# Patient Record
Sex: Male | Born: 1975
Health system: Southern US, Community
[De-identification: ages and names within clinical notes are randomized; demographics above are authoritative.]

## PROBLEM LIST (undated history)

## (undated) DIAGNOSIS — K219 Gastro-esophageal reflux disease without esophagitis: Secondary | ICD-10-CM

## (undated) DIAGNOSIS — J45909 Unspecified asthma, uncomplicated: Secondary | ICD-10-CM

## (undated) DIAGNOSIS — G8929 Other chronic pain: Secondary | ICD-10-CM

## (undated) DIAGNOSIS — I10 Essential (primary) hypertension: Secondary | ICD-10-CM

## (undated) DIAGNOSIS — T7840XA Allergy, unspecified, initial encounter: Secondary | ICD-10-CM

## (undated) DIAGNOSIS — F419 Anxiety disorder, unspecified: Secondary | ICD-10-CM

## (undated) DIAGNOSIS — R51 Headache: Secondary | ICD-10-CM

## (undated) DIAGNOSIS — R519 Headache, unspecified: Secondary | ICD-10-CM

## (undated) HISTORY — DX: Allergy, unspecified, initial encounter: T78.40XA

## (undated) HISTORY — PX: SHOULDER SURGERY: SHX246

## (undated) HISTORY — DX: Gastro-esophageal reflux disease without esophagitis: K21.9

## (undated) HISTORY — DX: Other chronic pain: G89.29

## (undated) HISTORY — DX: Essential (primary) hypertension: I10

## (undated) HISTORY — DX: Headache: R51

## (undated) HISTORY — DX: Anxiety disorder, unspecified: F41.9

## (undated) HISTORY — DX: Unspecified asthma, uncomplicated: J45.909

## (undated) HISTORY — PX: HAND SURGERY: SHX662

## (undated) HISTORY — DX: Headache, unspecified: R51.9

---

## 1997-07-28 ENCOUNTER — Emergency Department (HOSPITAL_COMMUNITY): Admission: EM | Admit: 1997-07-28 | Discharge: 1997-07-28 | Payer: Self-pay | Admitting: Emergency Medicine

## 1999-10-30 ENCOUNTER — Encounter: Payer: Self-pay | Admitting: Emergency Medicine

## 1999-10-30 ENCOUNTER — Emergency Department (HOSPITAL_COMMUNITY): Admission: EM | Admit: 1999-10-30 | Discharge: 1999-10-30 | Payer: Self-pay | Admitting: Emergency Medicine

## 2000-02-24 ENCOUNTER — Encounter: Payer: Self-pay | Admitting: General Surgery

## 2000-02-24 ENCOUNTER — Encounter: Payer: Self-pay | Admitting: Emergency Medicine

## 2000-02-24 ENCOUNTER — Inpatient Hospital Stay (HOSPITAL_COMMUNITY): Admission: EM | Admit: 2000-02-24 | Discharge: 2000-02-28 | Payer: Self-pay

## 2000-02-25 ENCOUNTER — Encounter: Payer: Self-pay | Admitting: General Surgery

## 2000-12-08 ENCOUNTER — Encounter: Payer: Self-pay | Admitting: Emergency Medicine

## 2000-12-08 ENCOUNTER — Emergency Department (HOSPITAL_COMMUNITY): Admission: EM | Admit: 2000-12-08 | Discharge: 2000-12-08 | Payer: Self-pay

## 2001-01-23 ENCOUNTER — Emergency Department (HOSPITAL_COMMUNITY): Admission: EM | Admit: 2001-01-23 | Discharge: 2001-01-23 | Payer: Self-pay

## 2002-04-18 ENCOUNTER — Encounter: Payer: Self-pay | Admitting: Emergency Medicine

## 2002-04-18 ENCOUNTER — Emergency Department (HOSPITAL_COMMUNITY): Admission: EM | Admit: 2002-04-18 | Discharge: 2002-04-18 | Payer: Self-pay | Admitting: Emergency Medicine

## 2002-04-22 ENCOUNTER — Ambulatory Visit (HOSPITAL_BASED_OUTPATIENT_CLINIC_OR_DEPARTMENT_OTHER): Admission: RE | Admit: 2002-04-22 | Discharge: 2002-04-22 | Payer: Self-pay | Admitting: Orthopedic Surgery

## 2003-01-24 ENCOUNTER — Emergency Department (HOSPITAL_COMMUNITY): Admission: AD | Admit: 2003-01-24 | Discharge: 2003-01-24 | Payer: Self-pay | Admitting: Family Medicine

## 2003-04-03 ENCOUNTER — Emergency Department (HOSPITAL_COMMUNITY): Admission: AD | Admit: 2003-04-03 | Discharge: 2003-04-03 | Payer: Self-pay | Admitting: Internal Medicine

## 2003-04-13 ENCOUNTER — Ambulatory Visit (HOSPITAL_BASED_OUTPATIENT_CLINIC_OR_DEPARTMENT_OTHER): Admission: RE | Admit: 2003-04-13 | Discharge: 2003-04-13 | Payer: Self-pay | Admitting: Orthopedic Surgery

## 2005-04-02 ENCOUNTER — Emergency Department (HOSPITAL_COMMUNITY): Admission: EM | Admit: 2005-04-02 | Discharge: 2005-04-02 | Payer: Self-pay | Admitting: Family Medicine

## 2005-08-16 ENCOUNTER — Ambulatory Visit: Payer: Self-pay | Admitting: Family Medicine

## 2005-09-17 ENCOUNTER — Ambulatory Visit: Payer: Self-pay | Admitting: Family Medicine

## 2006-01-14 ENCOUNTER — Emergency Department (HOSPITAL_COMMUNITY): Admission: EM | Admit: 2006-01-14 | Discharge: 2006-01-14 | Payer: Self-pay | Admitting: Emergency Medicine

## 2006-03-12 ENCOUNTER — Ambulatory Visit: Payer: Self-pay | Admitting: Family Medicine

## 2007-07-06 ENCOUNTER — Emergency Department (HOSPITAL_COMMUNITY): Admission: EM | Admit: 2007-07-06 | Discharge: 2007-07-06 | Payer: Self-pay | Admitting: Emergency Medicine

## 2007-08-04 ENCOUNTER — Ambulatory Visit: Payer: Self-pay | Admitting: Family Medicine

## 2007-09-15 ENCOUNTER — Ambulatory Visit: Payer: Self-pay | Admitting: Family Medicine

## 2008-01-19 ENCOUNTER — Encounter: Admission: RE | Admit: 2008-01-19 | Discharge: 2008-01-19 | Payer: Self-pay | Admitting: Internal Medicine

## 2008-03-12 ENCOUNTER — Ambulatory Visit (HOSPITAL_BASED_OUTPATIENT_CLINIC_OR_DEPARTMENT_OTHER): Admission: RE | Admit: 2008-03-12 | Discharge: 2008-03-12 | Payer: Self-pay | Admitting: Urology

## 2008-06-30 ENCOUNTER — Encounter (INDEPENDENT_AMBULATORY_CARE_PROVIDER_SITE_OTHER): Payer: Self-pay | Admitting: *Deleted

## 2008-06-30 ENCOUNTER — Emergency Department (HOSPITAL_COMMUNITY): Admission: EM | Admit: 2008-06-30 | Discharge: 2008-06-30 | Payer: Self-pay | Admitting: Emergency Medicine

## 2008-07-01 ENCOUNTER — Ambulatory Visit: Payer: Self-pay | Admitting: Family Medicine

## 2008-07-01 DIAGNOSIS — R209 Unspecified disturbances of skin sensation: Secondary | ICD-10-CM | POA: Insufficient documentation

## 2008-07-01 DIAGNOSIS — F411 Generalized anxiety disorder: Secondary | ICD-10-CM | POA: Insufficient documentation

## 2008-07-01 DIAGNOSIS — I10 Essential (primary) hypertension: Secondary | ICD-10-CM

## 2008-07-01 LAB — CONVERTED CEMR LAB
Bilirubin Urine: NEGATIVE
Glucose, Urine, Semiquant: NEGATIVE
Urobilinogen, UA: NEGATIVE
WBC Urine, dipstick: NEGATIVE
pH: 5

## 2008-07-02 LAB — CONVERTED CEMR LAB
ALT: 30 units/L (ref 0–53)
AST: 25 units/L (ref 0–37)
Alkaline Phosphatase: 53 units/L (ref 39–117)
Basophils Absolute: 0 10*3/uL (ref 0.0–0.1)
CO2: 28 meq/L (ref 19–32)
Calcium: 9.3 mg/dL (ref 8.4–10.5)
Chlamydia, Swab/Urine, PCR: NEGATIVE
Creatinine, Ser: 1.2 mg/dL (ref 0.4–1.5)
Eosinophils Relative: 3.5 % (ref 0.0–5.0)
GC Probe Amp, Urine: NEGATIVE
GFR calc non Af Amer: 88.7 mL/min (ref >60)
HCT: 42.6 % (ref 39.0–52.0)
HDL: 49.1 mg/dL (ref >39.00)
Hemoglobin: 14.6 g/dL (ref 13.0–17.0)
Lymphocytes Relative: 29 % (ref 12.0–46.0)
MCV: 95 fL (ref 78.0–100.0)
Monocytes Relative: 5.1 % (ref 3.0–12.0)
Neutro Abs: 2.3 10*3/uL (ref 1.4–7.7)
Platelets: 252 10*3/uL (ref 150.0–400.0)
RBC: 4.49 M/uL (ref 4.22–5.81)
RDW: 12.3 % (ref 11.5–14.6)
Sodium: 140 meq/L (ref 135–145)
Total Bilirubin: 1 mg/dL (ref 0.3–1.2)
Total CHOL/HDL Ratio: 3
Triglycerides: 76 mg/dL (ref 0.0–149.0)
VLDL: 15.2 mg/dL (ref 0.0–40.0)

## 2008-07-05 ENCOUNTER — Encounter (INDEPENDENT_AMBULATORY_CARE_PROVIDER_SITE_OTHER): Payer: Self-pay | Admitting: *Deleted

## 2008-07-07 ENCOUNTER — Telehealth: Payer: Self-pay | Admitting: Family Medicine

## 2008-07-14 ENCOUNTER — Ambulatory Visit: Payer: Self-pay | Admitting: Family Medicine

## 2008-07-19 ENCOUNTER — Ambulatory Visit: Payer: Self-pay | Admitting: Family Medicine

## 2008-07-22 ENCOUNTER — Telehealth (INDEPENDENT_AMBULATORY_CARE_PROVIDER_SITE_OTHER): Payer: Self-pay | Admitting: *Deleted

## 2008-08-05 ENCOUNTER — Telehealth (INDEPENDENT_AMBULATORY_CARE_PROVIDER_SITE_OTHER): Payer: Self-pay | Admitting: *Deleted

## 2008-10-12 ENCOUNTER — Emergency Department (HOSPITAL_COMMUNITY): Admission: EM | Admit: 2008-10-12 | Discharge: 2008-10-12 | Payer: Self-pay | Admitting: Emergency Medicine

## 2009-09-20 ENCOUNTER — Ambulatory Visit: Payer: Self-pay | Admitting: Family Medicine

## 2009-09-20 DIAGNOSIS — J45909 Unspecified asthma, uncomplicated: Secondary | ICD-10-CM | POA: Insufficient documentation

## 2009-09-20 DIAGNOSIS — K219 Gastro-esophageal reflux disease without esophagitis: Secondary | ICD-10-CM

## 2009-09-20 DIAGNOSIS — R51 Headache: Secondary | ICD-10-CM

## 2009-09-20 DIAGNOSIS — J309 Allergic rhinitis, unspecified: Secondary | ICD-10-CM | POA: Insufficient documentation

## 2009-09-20 DIAGNOSIS — R519 Headache, unspecified: Secondary | ICD-10-CM | POA: Insufficient documentation

## 2009-09-20 LAB — CONVERTED CEMR LAB
Bilirubin Urine: NEGATIVE
Nitrite: NEGATIVE
Urobilinogen, UA: 0.2

## 2009-09-21 LAB — CONVERTED CEMR LAB
Albumin: 4 g/dL (ref 3.5–5.2)
BUN: 17 mg/dL (ref 6–23)
Basophils Absolute: 0.1 10*3/uL (ref 0.0–0.1)
CO2: 27 meq/L (ref 19–32)
Chloride: 104 meq/L (ref 96–112)
Cholesterol: 169 mg/dL (ref 0–200)
Creatinine, Ser: 1 mg/dL (ref 0.4–1.5)
Eosinophils Absolute: 0.4 10*3/uL (ref 0.0–0.7)
Glucose, Bld: 77 mg/dL (ref 70–99)
HCT: 38.6 % — ABNORMAL LOW (ref 39.0–52.0)
LDL Cholesterol: 97 mg/dL (ref 0–99)
Lymphs Abs: 1.5 10*3/uL (ref 0.7–4.0)
MCHC: 34.4 g/dL (ref 30.0–36.0)
MCV: 93.5 fL (ref 78.0–100.0)
Monocytes Absolute: 0.7 10*3/uL (ref 0.1–1.0)
Neutro Abs: 4.9 10*3/uL (ref 1.4–7.7)
Neutrophils Relative %: 65.2 % (ref 43.0–77.0)
Platelets: 279 10*3/uL (ref 150.0–400.0)
RDW: 13.4 % (ref 11.5–14.6)
TSH: 0.69 microintl units/mL (ref 0.35–5.50)
Total Protein: 6.5 g/dL (ref 6.0–8.3)
Triglycerides: 174 mg/dL — ABNORMAL HIGH (ref 0.0–149.0)
WBC: 7.5 10*3/uL (ref 4.5–10.5)

## 2009-11-21 ENCOUNTER — Telehealth: Payer: Self-pay | Admitting: Family Medicine

## 2009-11-28 ENCOUNTER — Ambulatory Visit: Payer: Self-pay | Admitting: Family Medicine

## 2009-12-02 ENCOUNTER — Ambulatory Visit: Payer: Self-pay | Admitting: Family Medicine

## 2009-12-12 ENCOUNTER — Ambulatory Visit: Payer: Self-pay | Admitting: Family Medicine

## 2010-02-17 ENCOUNTER — Telehealth: Payer: Self-pay | Admitting: Family Medicine
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
## 2010-02-21 NOTE — Progress Notes (Signed)
Summary: REQ FOR CHANGE IN DOSAGE OF MED (Lisinopril)  Phone Note Call from Patient   Caller: Patient   [phone] Summary of Call: Pt feels that his Lisnopril may be too strong.Marland KitchenMarland KitchenMarland KitchenMarland Kitchen wants to know if he can have dosage decreased.... Advised he is exp a "jittery" feeling when he exerts any energy and just doesn't feel like himself - feels that this is due to the medication.... Pt would like a return call at  [phone].  Initial call taken by: Debbra Riding,  November 21, 2009 9:08 AM  Follow-up for Phone Call        problem+++++++++++++++================ov Follow-up by: Roderick Pee MD,  November 21, 2009 9:10 AM  Additional Follow-up for Phone Call Additional follow up Details #1::        I offered him an OV but he stated that he wanted to speak with someone first before coming in but I will tell him again that he needs to come in for OV.  Called and left msg for pt to c/b so appt could be rescheduled..... Debbra Riding  November 21, 2009 10:30 AM  LMOM....Marland KitchenMarland KitchenDebbra Riding  November 21, 2009 12:07 PM   Additional Follow-up by: Debbra Riding,  November 21, 2009 9:46 AM    Additional Follow-up for Phone Call Additional follow up Details #2::    I made contact with pt and appt was scheduled for 11/28/2009  at  1:45pm.  Follow-up by: Debbra Riding,  November 21, 2009 3:18 PM

## 2010-03-01 NOTE — Progress Notes (Signed)
Summary: REQUEST FOR PHARMACY CLARIFICATION  Phone Note Call from Patient   Caller: Patient Summary of Call: Pt states that [facility] is telling him that they do not have a Rx for Maxide / TRIAMTERENE-HCTZ..... Pt would like someone to call [facility] to clarify same.... Pt can be reached at  [phone] with any questions or concerns.  Initial call taken by: Debbra Riding,  February 17, 2010 10:23 AM  Follow-up for Phone Call        [facility] please call and clarify Follow-up by: Roderick Pee MD,  February 17, 2010 2:13 PM  Additional Follow-up for Phone Call Additional follow up Details #1::        Phone Call Completed Additional Follow-up by: Kern Reap CMA Duncan Dull),  February 20, 2010 11:43 AM

## 2010-04-17 ENCOUNTER — Ambulatory Visit: Payer: Self-pay | Admitting: Family Medicine

## 2010-04-18 ENCOUNTER — Encounter: Payer: Self-pay | Admitting: Family Medicine

## 2010-04-18 ENCOUNTER — Ambulatory Visit (INDEPENDENT_AMBULATORY_CARE_PROVIDER_SITE_OTHER): Payer: 59 | Admitting: Family Medicine

## 2010-04-18 DIAGNOSIS — M25519 Pain in unspecified shoulder: Secondary | ICD-10-CM

## 2010-04-18 DIAGNOSIS — J45909 Unspecified asthma, uncomplicated: Secondary | ICD-10-CM

## 2010-04-18 DIAGNOSIS — M25511 Pain in right shoulder: Secondary | ICD-10-CM

## 2010-04-18 DIAGNOSIS — J309 Allergic rhinitis, unspecified: Secondary | ICD-10-CM

## 2010-04-18 MED ORDER — PREDNISONE 20 MG PO TABS: ORAL_TABLET | ORAL | Status: DC

## 2010-04-18 NOTE — Patient Instructions (Signed)
Prednisone two tabs x 3 days, one x 3 days, a half x 3 days, then half a tablet Monday, Wednesday, Friday, for a 3-week taper.  Return in one week for follow-up.  Call the folks at [facility] for evaluation of your shoulder........Marland Kitchen Dr. Cleophas Dunker
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
## 2010-04-25 ENCOUNTER — Ambulatory Visit (INDEPENDENT_AMBULATORY_CARE_PROVIDER_SITE_OTHER): Payer: 59 | Admitting: Family Medicine

## 2010-04-25 ENCOUNTER — Encounter: Payer: Self-pay | Admitting: Family Medicine

## 2010-04-25 DIAGNOSIS — J45909 Unspecified asthma, uncomplicated: Secondary | ICD-10-CM

## 2010-04-25 DIAGNOSIS — M25511 Pain in right shoulder: Secondary | ICD-10-CM

## 2010-04-25 MED ORDER — HYDROCODONE-ACETAMINOPHEN 7.5-750 MG PO TABS: 1.0000 | ORAL_TABLET | Freq: Four times a day (QID) | ORAL | Status: DC | PRN

## 2010-04-25 MED ORDER — ALBUTEROL 90 MCG/ACT IN AERS: INHALATION_SPRAY | RESPIRATORY_TRACT | Status: DC

## 2010-04-25 NOTE — Progress Notes (Signed)
  Subjective:    Patient ID: [name], male    DOB: 11-13-1975, 35 y.o.   MRN: [id number]  HPIlamont Is a 35 year old male, who comes in today for reevaluation of asthma.  He is currently on tube prednisone tablets daily, and has been for the past week.  He is not tapered because he has not felt back to normal.  He went jogging today and had a flareup of his asthma.  He thinks he has an inhaler at home, but is not sure.  He also had an injection by Dr. Cleophas Dunker in his right shoulder.  Yesterday however today his pain is worse.  Advised to call the orthopedist and go back and see him tomorrow    Review of Systems General and pulmonary review of systems otherwise negative    Objective:   Physical Exam    Well-developed well-nourished, male in no acute distress.  Lungs are clear.  No wheezing    Assessment & Plan:  Asthma resolving Taper prednisone.  Exercise-induced asthma.  Albuterol one shot 15 minutes prior to exercise

## 2010-04-25 NOTE — Patient Instructions (Signed)
Begin to taper the prednisone by taking one tablet daily for 3 days, a half x 3 days, then half a tablet Monday, Wednesday, Friday, for a two-week taper.  Albuterol one puff 15 minutes prior to exercise.  Return p.r.n.Jeanene Erb the orthopedist and make an appointment to be seen tomorrow for your shoulder pain

## 2010-05-09 LAB — POCT HEMOGLOBIN-HEMACUE: Hemoglobin: 15.6 g/dL (ref 13.0–17.0)

## 2010-06-06 NOTE — Op Note (Signed)
[name], [name]             ACCOUNT NO.:  [id number]   MEDICAL RECORD NO.:  [id number]          PATIENT TYPE:  AMB   LOCATION:  [facility]                         FACILITY:  [facility]   PHYSICIAN:  Mark C. Vernie Ammons, M.D.  DATE OF BIRTH:  Nov 25, 1975   DATE OF PROCEDURE:  03/12/2008  DATE OF DISCHARGE:                               OPERATIVE REPORT   PREOPERATIVE DIAGNOSES:  1. Extensive glandular adhesions.  2. Patient desires sterility.   POSTOPERATIVE DIAGNOSES:  1. Extensive glandular adhesions.  2. Patient desires sterility.   PROCEDURE:  1. Plastic correction of extensive glandular adhesions.  2. Bilateral vasectomy.   SURGEON:  Mark C. Vernie Ammons, M.D.   ANESTHESIA:  General with local supplement.   BLOOD LOSS:  Minimal.   SPECIMENS:  None.   DRAINS:  None.   COMPLICATIONS:  None.   INDICATIONS:  The patient is a 35 year old male who has had a long  history of glandular adhesions which cause pain with erection.  He has  been circumcised in the past and also notes a buildup of a whitish  material beneath the adhesions.  In addition, he desires to undergo  vasectomy for contraceptive purposes.  I have discussed the procedure  that would be required to correct his extensive glandular adhesions and  discussed vasectomy in detail with the patient.  We went over the risks  and complications as well and he understands and has elected to proceed.   DESCRIPTION OF OPERATION:  After informed consent, the patient was  brought to the major OR, placed on the table, administered general  anesthesia.  His genitalia was sterilely prepped and draped and an  official time-out was then performed.  I injected 10 mL of 0.5% plain  Marcaine at the base of the penis to perform a dorsal penile block in a  standard fashion.   I then directed my attention to the midline of the scrotum and injected  local anesthetic in this location.  Using the no scalpel vasectomy  instrument, I pierced the  scrotal skin and opened a small opening of  approximately 5 mm.  Through this the ring shaped vas clamp was passed  through the skin incision and the left vas was grasped and brought up to  the incision.  I cleared the perivasal tissue and delivered the vas  alone.  A segment was isolated and then ligated proximally and distally  with 2-0 silk suture.  The intervening segment was then excised and the  lumens proximally and distally were cauterized.  This was then allowed  to drop back into the scrotum and the right side was treated in an  identical fashion.   Attention was then directed to the penis where I noted the most dense  area of adhesion was ventrally; however, dorsally there were several  areas of dense adhesion as well.  I proceeded by first placing a curved  hemostat beneath the adhesion and made sure that there was a passage  beneath this to delineate it as a bridge of skin.  After doing this, I  then placed the hemostat in the midportion  of the skin bridge, clamped  it and released it and then cut with the tenotomy scissors through the  clamped area.  Minimal bleeding occurred because of this technique.  I  then performed this on several other very dense adhesions dorsally until  all of the dorsal adhesions had been treated completely and freed from  the glans.  I then directed my attention to the ventral surface where a  very thick adhesion was present.  This required me to place the hemostat  underneath the bridge which did not communicate completely.  I,  therefore, clamped the hemostat both on the right and left sides and  incised along this clamped area which released some skin but there was  some tethering frenulum.  I then incised the tethered frenulum.   Due to the extensive nature of the adhesion ventrally, I had to close  the incision on the glans and did so with a running 5-0 chromic suture.  The skin on the ventral shaft was also reapproximated in the midline   with a running 5-0 chromic suture.  The remaining areas where the  adhesions had been taken down did not appear to require any suturing and  there was good control of bleeding.  I, therefore, applied Neosporin and  a Vaseline gauze dressing followed by loosely applied folded 4x4s and a  loosely applied Kerlix.  The patient was awakened and taken to the  recovery room in stable satisfactory condition.  He tolerated the  procedure well, there were no intraoperative complications.   He will be given a prescription for Tylox, #40, and Keflex 250 mg  t.i.d., #15, and will follow-up in my office in approximately 4 weeks.      Mark C. Vernie Ammons, M.D.  Electronically Signed     MCO/MEDQ  D:  03/12/2008  T:  03/12/2008  Job:  [id number]

## 2010-06-09 NOTE — Op Note (Signed)
   NAME:  [name], GEDNEY                       ACCOUNT NO.:  [id number]   MEDICAL RECORD NO.:  [id number]                   PATIENT TYPE:  AMB   LOCATION:  [facility]                                  FACILITY:  [facility]   PHYSICIAN:  Artist Pais. Mina Marble, M.D.           DATE OF BIRTH:  Oct 13, 1975   DATE OF PROCEDURE:  DATE OF DISCHARGE:                                 OPERATIVE REPORT   PREOPERATIVE DIAGNOSES:  Right fifth metacarpal head and neck fracture.   POSTOPERATIVE DIAGNOSIS:  Iron rodding above fractures, 1.6 mm head  Innovation.   ANESTHESIA:  General.   TOURNIQUET TIME:  25 minutes.   COMPLICATIONS:  None.   DRAINS:  None.   DESCRIPTION OF PROCEDURE:  The patient was taken to the operating room after  induction of adequate general anesthesia. The right upper extremity was  prepped and draped in usual sterile fashion.  An Esmarch was used to  exsanguinate the limb. The tourniquet inflated to 250 mmHg. At this point in  time a transverse incision was made with the base of the fifth metacarpal.  Incision was taken down through the skin and subcutaneous tissue. The  extensor tendons and branches of the sensory ulnar nerve were refracted. The  1.6 mm iron rod awl was used to create a dorsal window in the bone. The iron  rod was introduced across the fracture site with the Johnson-Weaver reducing  the fracture. Intraoperative x-rays in the AP, lateral, and oblique view  shows reduction of both the AP and lateral plain in the oblique view. It was  bent beneath the skin. The wound was thoroughly irrigated and closed with 5-  0 nylon. The patient was placed in sterile dressing. Xeroform and 4x4 gauze  and ulnar gutter splint. The patient tolerated the procedure well. Went to  the recovery room in stable fashion.                                               Artist Pais Mina Marble, M.D.    MAW/MEDQ  D:  04/22/2002  T:  04/22/2002  Job:  [id number]
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
## 2010-08-22 ENCOUNTER — Encounter: Payer: Self-pay | Admitting: Family Medicine

## 2010-08-22 ENCOUNTER — Ambulatory Visit (INDEPENDENT_AMBULATORY_CARE_PROVIDER_SITE_OTHER): Payer: 59 | Admitting: Family Medicine

## 2010-08-22 VITALS — BP 136/70 | HR 81 | Temp 98.6°F | Wt 277.0 lb

## 2010-08-22 DIAGNOSIS — F411 Generalized anxiety disorder: Secondary | ICD-10-CM

## 2010-08-22 DIAGNOSIS — M25511 Pain in right shoulder: Secondary | ICD-10-CM

## 2010-08-22 DIAGNOSIS — M25519 Pain in unspecified shoulder: Secondary | ICD-10-CM

## 2010-08-22 DIAGNOSIS — F419 Anxiety disorder, unspecified: Secondary | ICD-10-CM

## 2010-08-22 MED ORDER — SERTRALINE HCL 50 MG PO TABS: 50.0000 mg | ORAL_TABLET | Freq: Every day | ORAL | Status: DC

## 2010-08-22 MED ORDER — HYDROCODONE-ACETAMINOPHEN 7.5-750 MG PO TABS: 1.0000 | ORAL_TABLET | Freq: Four times a day (QID) | ORAL | Status: DC | PRN

## 2010-08-22 NOTE — Progress Notes (Signed)
  Subjective:    Patient ID: [name], male    DOB: 1975/11/29, 35 y.o.   MRN: [id number]  HPI Here for 2 reasons. First his right shoulder pain has been getting worse, and he is not sure what to do. He saw Dr. Tawanna Cooler for this last March, and he was referred to Dr. Norlene Campbell. I am not sure what Dr. Hoy Register diagnosis was but he gave him a steroid shot that day. Apparently plain Xrays were unremarkable. The shot did not really help, and the pain has gotten to the point where it seriously interferes with his job. He takes Tylenol at times for it. Second, he has been under a lot of stress lately. Part of this is from his wedding plans coming up. He used Zoloft several years ago, and it helped then.    Review of Systems  Constitutional: Negative.   Musculoskeletal: Positive for arthralgias.  Psychiatric/Behavioral: The patient is nervous/anxious.        Objective:   Physical Exam  Constitutional: He appears well-developed and well-nourished.  Musculoskeletal:       Mildly tender in the anterior right shoulder. ROM is full actively and passively, although extremes of motion are quit painful  Psychiatric: His behavior is normal. Thought content normal.       Very anxious          Assessment & Plan:   Start back on Zoloft at 50 mg a day. See Dr. Tawanna Cooler in a few weeks. He should call Dr. Cleophas Dunker for another appt soon. Given some Vicodin to use in the meantime

## 2010-08-28 ENCOUNTER — Other Ambulatory Visit: Payer: Self-pay | Admitting: Orthopaedic Surgery

## 2010-08-28 DIAGNOSIS — M25511 Pain in right shoulder: Secondary | ICD-10-CM

## 2010-09-01 ENCOUNTER — Ambulatory Visit
Admission: RE | Admit: 2010-09-01 | Discharge: 2010-09-01 | Disposition: A | Discharge status: Home / Self Care | Payer: 59 | Source: Ambulatory Visit | Attending: Orthopaedic Surgery | Admitting: Orthopaedic Surgery

## 2010-09-01 DIAGNOSIS — M25511 Pain in right shoulder: Secondary | ICD-10-CM

## 2010-09-01 MED ORDER — IOHEXOL 180 MG/ML  SOLN
1.0000 mL | Freq: Once | INTRAMUSCULAR | Status: AC | PRN
  Administered 2010-09-01: 1 mL via INTRA_ARTICULAR

## 2010-09-18 ENCOUNTER — Other Ambulatory Visit: Payer: Self-pay | Admitting: Family Medicine

## 2010-09-18 ENCOUNTER — Other Ambulatory Visit: Payer: Self-pay

## 2010-09-18 NOTE — Telephone Encounter (Signed)
What it is he taking the medication for her and how many.  He does he take a day

## 2010-09-18 NOTE — Telephone Encounter (Signed)
Pt called req refill of HYDROcodone-acetaminophen (VICODIN ES) 7.5-750 MG per tablet called in to [facility] on [address]

## 2010-09-19 NOTE — Telephone Encounter (Signed)
Patient is taking vicodin es for shoulder pain every 6 hours prn.

## 2010-09-19 NOTE — Telephone Encounter (Signed)
patient  Is taking

## 2010-09-19 NOTE — Telephone Encounter (Signed)
Left message on machine for patient  To return our call 

## 2010-09-20 NOTE — Telephone Encounter (Signed)
He was referred to Dr. Cleophas Dunker last March,,,,,,,,,,, he needs to call Dr. Cleophas Dunker is having persistent problems

## 2010-09-21 NOTE — Telephone Encounter (Signed)
Spoke with patient.

## 2010-09-22 ENCOUNTER — Encounter: Payer: Self-pay | Admitting: Family Medicine

## 2010-10-03 ENCOUNTER — Other Ambulatory Visit (INDEPENDENT_AMBULATORY_CARE_PROVIDER_SITE_OTHER): Payer: 59

## 2010-10-03 DIAGNOSIS — Z0000 Encounter for general adult medical examination without abnormal findings: Secondary | ICD-10-CM

## 2010-10-03 LAB — LIPID PANEL
Cholesterol: 172 mg/dL (ref 0–200)
HDL: 52 mg/dL (ref >39.00)
LDL Cholesterol: 98 mg/dL (ref 0–99)
Triglycerides: 111 mg/dL (ref 0.0–149.0)
VLDL: 22.2 mg/dL (ref 0.0–40.0)

## 2010-10-03 LAB — POCT URINALYSIS DIPSTICK
Bilirubin, UA: NEGATIVE
Blood, UA: NEGATIVE
Ketones, UA: NEGATIVE
Protein, UA: NEGATIVE
Spec Grav, UA: 1.025
pH, UA: 5.5

## 2010-10-03 LAB — BASIC METABOLIC PANEL
BUN: 17 mg/dL (ref 6–23)
CO2: 24 mEq/L (ref 19–32)
Chloride: 104 mEq/L (ref 96–112)
Creatinine, Ser: 1.1 mg/dL (ref 0.4–1.5)
GFR: 101.06 mL/min (ref >60.00)
Glucose, Bld: 83 mg/dL (ref 70–99)
Potassium: 3.5 mEq/L (ref 3.5–5.1)
Sodium: 139 mEq/L (ref 135–145)

## 2010-10-03 LAB — CBC WITH DIFFERENTIAL/PLATELET
Basophils Absolute: 0 10*3/uL (ref 0.0–0.1)
Lymphs Abs: 2.7 10*3/uL (ref 0.7–4.0)
MCV: 93.8 fl (ref 78.0–100.0)
Monocytes Absolute: 0.7 10*3/uL (ref 0.1–1.0)
Neutro Abs: 4.3 10*3/uL (ref 1.4–7.7)
Platelets: 288 10*3/uL (ref 150.0–400.0)
RDW: 14 % (ref 11.5–14.6)
WBC: 7.9 10*3/uL (ref 4.5–10.5)

## 2010-10-03 LAB — HEPATIC FUNCTION PANEL
AST: 22 U/L (ref 0–37)
Total Bilirubin: 0.5 mg/dL (ref 0.3–1.2)

## 2010-10-03 LAB — TSH: TSH: 0.79 u[IU]/mL (ref 0.35–5.50)

## 2010-10-11 ENCOUNTER — Encounter: Payer: Self-pay | Admitting: Family Medicine

## 2010-10-12 ENCOUNTER — Encounter: Payer: Self-pay | Admitting: Family Medicine

## 2010-10-12 ENCOUNTER — Ambulatory Visit (INDEPENDENT_AMBULATORY_CARE_PROVIDER_SITE_OTHER): Payer: 59 | Admitting: Family Medicine

## 2010-10-12 DIAGNOSIS — R7309 Other abnormal glucose: Secondary | ICD-10-CM

## 2010-10-12 DIAGNOSIS — J309 Allergic rhinitis, unspecified: Secondary | ICD-10-CM

## 2010-10-12 DIAGNOSIS — I10 Essential (primary) hypertension: Secondary | ICD-10-CM

## 2010-10-12 DIAGNOSIS — Z23 Encounter for immunization: Secondary | ICD-10-CM

## 2010-10-12 DIAGNOSIS — F411 Generalized anxiety disorder: Secondary | ICD-10-CM

## 2010-10-12 DIAGNOSIS — M25511 Pain in right shoulder: Secondary | ICD-10-CM

## 2010-10-12 DIAGNOSIS — J45909 Unspecified asthma, uncomplicated: Secondary | ICD-10-CM

## 2010-10-12 DIAGNOSIS — M25519 Pain in unspecified shoulder: Secondary | ICD-10-CM

## 2010-10-12 LAB — GLUCOSE, POCT (MANUAL RESULT ENTRY): POC Glucose: 106

## 2010-10-12 MED ORDER — SERTRALINE HCL 100 MG PO TABS: 100.0000 mg | ORAL_TABLET | Freq: Every day | ORAL | Status: DC

## 2010-10-12 MED ORDER — PREDNISONE 20 MG PO TABS: ORAL_TABLET | ORAL | Status: DC

## 2010-10-12 MED ORDER — TRIAMTERENE-HCTZ 37.5-25 MG PO TABS: 1.0000 | ORAL_TABLET | Freq: Every day | ORAL | Status: DC

## 2010-10-12 MED ORDER — HYDROCODONE-HOMATROPINE 5-1.5 MG/5ML PO SYRP
ORAL_SOLUTION | ORAL | Status: DC
Start: 2010-10-12 — End: 2011-02-06

## 2010-10-12 MED ORDER — ALBUTEROL 90 MCG/ACT IN AERS: INHALATION_SPRAY | RESPIRATORY_TRACT | Status: DC

## 2010-10-12 MED ORDER — SERTRALINE HCL 50 MG PO TABS: 50.0000 mg | ORAL_TABLET | Freq: Every day | ORAL | Status: DC

## 2010-10-12 MED ORDER — TRAMADOL HCL 50 MG PO TABS: ORAL_TABLET | ORAL | Status: DC

## 2010-10-12 NOTE — Patient Instructions (Signed)
Take the prednisone as directed for your asthma, secondary to the viral infection.  You can take one puff of albuterol 4 times a day as needed.  Drink lots of water.  Hydromet one half to 1 teaspoon at bedtime p.r.n.  Continued the Zoloft one tablet daily at bedtime.  Continue the Maxzide one daily.  When you feel shaky checked y  blood sugar.  Keep record of your blood sugar readings and return in two weeks for follow-up with the data.  Call [facility] and make an appointment to see Dr. Caryn Bee  supple for a second opinion.  On your shoulder pain

## 2010-10-12 NOTE — Progress Notes (Signed)
Subjective:    Patient ID: [name], male    DOB: 12/11/1975, 35 y.o.   MRN: [id number]  HPI [name] is a 35 year old, married male, nonsmoker, who comes in today accompanied by his wife for a physical exam.  He states he went to see Dr. Cleophas Dunker about his right and left shoulder pain and Dr. Cleophas Dunker did not think there was anything surgically wrong and incised him to do his normal exercises and activities.  However, he thinks there is something wrong with his shoulders because he said about ongoing pain, worse on the right shoulder than the left.  He would like a second opinion.  He has had a cold for about 4 days and a trigger his asthma.  He typically has a dinner off for exercise-induced asthma.  He states he feels he has a low blood sugar.  His spells revealed span is some sugar, but it doesn't help.  We will give him a glucometer to, document what's going on.  He takes Zoloft 50 mg nightly for mild depression, Maxzide 37 -- 25 daily for hypertension.  BP 130/84.  Tetanus booster and flu shot given today   Review of Systems  Constitutional: Negative.   HENT: Negative.   Eyes: Negative.   Respiratory: Negative.   Cardiovascular: Negative.   Gastrointestinal: Negative.   Genitourinary: Negative.   Musculoskeletal: Negative.   Skin: Negative.   Neurological: Negative.   Hematological: Negative.   Psychiatric/Behavioral: Negative.        Objective:   Physical Exam  Constitutional: He is oriented to person, place, and time. He appears well-developed and well-nourished.  HENT:  Head: Normocephalic and atraumatic.  Right Ear: External ear normal.  Left Ear: External ear normal.  Nose: Nose normal.  Mouth/Throat: Oropharynx is clear and moist.  Eyes: Conjunctivae and EOM are normal. Pupils are equal, round, and reactive to light.  Neck: Normal range of motion. Neck supple. No JVD present. No tracheal deviation present. No thyromegaly present.  Cardiovascular:  Normal rate, regular rhythm, normal heart sounds and intact distal pulses.  Exam reveals no gallop and no friction rub.   No murmur heard. Pulmonary/Chest: Effort normal. No stridor. No respiratory distress. He has wheezes. He has no rales. He exhibits no tenderness.       Is not tolerate normal.  Mild late expiratory wheezing bilaterally  Abdominal: Soft. Bowel sounds are normal. He exhibits no distension and no mass. There is no tenderness. There is no rebound and no guarding.  Genitourinary: Penis normal.  Musculoskeletal: Normal range of motion. He exhibits no edema and no tenderness.  Lymphadenopathy:    He has no cervical adenopathy.  Neurological: He is alert and oriented to person, place, and time. He has normal reflexes. No cranial nerve deficit. He exhibits normal muscle tone.  Skin: Skin is warm and dry. No rash noted. No erythema. No pallor.  Psychiatric: He has a normal mood and affect. His behavior is normal. Judgment and thought content normal.          Assessment & Plan:  Healthy male.  Exercise-induced asthma.  Continue Ventolin p.r.n.  Asthma triggered by a viral infection.  Plan prednisone burst and taper.  Mild depression.  Continue Zoloft 50 mg nightly  Hypertension.  Continue Maxzide 37.5 days, 25 daily.  Right and left shoulder pain.  A second opinion with Dr. Caryn Ortega supple.  Episodes of low blood sugar????????? He was given a glucometer to measure his blood sugar when he symptomatic follow-up  in two weeks

## 2010-10-30 ENCOUNTER — Ambulatory Visit: Payer: 59 | Admitting: Family Medicine

## 2010-12-16 ENCOUNTER — Other Ambulatory Visit: Payer: Self-pay | Admitting: Family Medicine

## 2011-02-06 ENCOUNTER — Ambulatory Visit (INDEPENDENT_AMBULATORY_CARE_PROVIDER_SITE_OTHER): Payer: 59 | Admitting: Family Medicine

## 2011-02-06 ENCOUNTER — Encounter: Payer: Self-pay | Admitting: *Deleted

## 2011-02-06 ENCOUNTER — Encounter: Payer: Self-pay | Admitting: Family Medicine

## 2011-02-06 VITALS — BP 110/78 | Temp 98.1°F | Wt 283.0 lb

## 2011-02-06 DIAGNOSIS — Z23 Encounter for immunization: Secondary | ICD-10-CM

## 2011-02-06 DIAGNOSIS — J45909 Unspecified asthma, uncomplicated: Secondary | ICD-10-CM | POA: Insufficient documentation

## 2011-02-06 MED ORDER — PREDNISONE 20 MG PO TABS: ORAL_TABLET | ORAL | Status: DC

## 2011-02-06 NOTE — Progress Notes (Signed)
  Subjective:    Patient ID: [name], male    DOB: 1975-11-12, 36 y.o.   MRN: [id number]  HPI [name] is a 36 year old male, who comes in today for evaluation of asthma.  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 He thinks.  However, he's been wheezing since last Wednesday.  He's had trouble in the past with allergic rhinitis and occasional asthma when he gets around something is allergic to   Review of Systems General and pulmonary review of systems otherwise negative.  He does have albuterol, which he takes prior to exercise to prevent exercise-induced asthma.  He has not been taking the albuterol for this episode    Objective:   Physical Exam  Well-developed well-nourished, male in no acute distress.  Examination of the HEENT were negative.  Neck was supple.  No adenopathy.  Lungs expiratory rate 10 and regular.  Symmetrical.  Breath sounds inspiratory and expiratory wheezing      Assessment & Plan:  Asthma plan albuterol treatment with nebulizer start prednisone.  Also use albuterol two puffs t.i.d. At home.  Follow-up in 48 hours

## 2011-02-06 NOTE — Patient Instructions (Signed)
Restart the prednisone by taking 3 tablets now then starting tomorrow morning 3 tabs every morning x 3 days, two tabs x 3 days, one tab x 3 days, a half a tab x 3 days, then half a tablet Monday, Wednesday, Friday, for a 3-week taper.  Rest at home.  Drinks lots of water.  Albuterol two puffs 4 times daily.  Return on Thursday afternoon for follow-up

## 2011-02-08 ENCOUNTER — Encounter: Payer: Self-pay | Admitting: Family Medicine

## 2011-02-08 ENCOUNTER — Ambulatory Visit (INDEPENDENT_AMBULATORY_CARE_PROVIDER_SITE_OTHER): Payer: 59 | Admitting: Family Medicine

## 2011-02-08 DIAGNOSIS — J45909 Unspecified asthma, uncomplicated: Secondary | ICD-10-CM

## 2011-02-08 NOTE — Patient Instructions (Signed)
Begin to taper the prednisone by taking two tabs x 3 days, one tab x 3 days, half a tab x 2 days, then half a tablet every other day for two weeks.  In the future if you around any dust or leaves or mold.  I would recommend that you wear a mask and use the albuterol one puff 3 times daily as needed.  If however, the use the albuterol, and it doesn't work or you use the albuterol for more than a week at a time daily, then we need to see you for reevaluation

## 2011-02-08 NOTE — Progress Notes (Signed)
  Subjective:    Patient ID: Suszanne Finch, male    DOB: 19-Nov-1975, 36 y.o.   MRN: [id number]  HPI Kannen is a 36 year old male, nonsmoker, who works for the city of [place], mostly outside, who comes in today for follow-up of asthma.  He was working in [employer] and although he was not outside........ He was driving the truck....... He develops severe asthma.  We start him on prednisone 60 mg daily, and he comes back today for follow-up.  Feeling much better.  In the, past.  He's only had exercise-induced asthma, for which he takes albuterol p.r.n.   Review of Systems General and pulmonary review of systems otherwise negative    Objective:   Physical Exam Well-developed well-nourished man in no acute distress.  Examination of the HEENT were negative.  Neck was supple.  No adenopathy.  Lungs are clear.  No wheezing       Assessment & Plan:  Asthma resolved.  Plan Taper prednisone,,,,,,,,,,, in the future to avoid mold etc., and if he can't advise to wear a mask and use albuterol p.r.n.

## 2011-05-04 ENCOUNTER — Telehealth: Payer: Self-pay | Admitting: Family Medicine

## 2011-05-04 ENCOUNTER — Ambulatory Visit (INDEPENDENT_AMBULATORY_CARE_PROVIDER_SITE_OTHER): Payer: 59 | Admitting: Family Medicine

## 2011-05-04 ENCOUNTER — Encounter: Payer: Self-pay | Admitting: Family Medicine

## 2011-05-04 VITALS — BP 140/96 | HR 102 | Temp 98.6°F | Wt 278.0 lb

## 2011-05-04 DIAGNOSIS — M25519 Pain in unspecified shoulder: Secondary | ICD-10-CM

## 2011-05-04 DIAGNOSIS — M25511 Pain in right shoulder: Secondary | ICD-10-CM

## 2011-05-04 DIAGNOSIS — J45909 Unspecified asthma, uncomplicated: Secondary | ICD-10-CM

## 2011-05-04 MED ORDER — PREDNISONE 20 MG PO TABS: ORAL_TABLET | ORAL | Status: DC

## 2011-05-04 MED ORDER — TRAMADOL HCL 50 MG PO TABS: ORAL_TABLET | ORAL | Status: DC

## 2011-05-04 NOTE — Progress Notes (Signed)
  Subjective:    Patient ID: [name], male    DOB: 11/17/1975, 36 y.o.   MRN: [id number]  HPI [name] is a 36 year old male who comes in today for evaluation of asthma  He is due to have shoulder surgery right next Wednesday by Hadassah Pais however 3 days ago his asthma flared up. He has no fever earache et [name].   Review of Systems General and pulmonary review of systems otherwise negative    Objective:   Physical Exam Well-developed well-nourished male in no acute distress HEENT negative neck was supple no adenopathy lungs showed very minimal late expiratory wheezing       Assessment & Plan:

## 2011-05-04 NOTE — Telephone Encounter (Signed)
Patient called stating that he is still wheezing and would like a refill on his prednisone because he is scheduled for surgery next week and the surgeon stated they will not do the surgery if he is still wheezing. Please advise.

## 2011-05-04 NOTE — Patient Instructions (Signed)
Take 3 prednisone tablets now then starting tomorrow morning 2 tabs x3 days and taper as directed  Tramadol one half to one tablet 3 times daily as needed for pain  Call you orthopedist on Monday to tell them what's going on

## 2011-05-04 NOTE — Telephone Encounter (Signed)
Office visit today

## 2011-05-04 NOTE — Telephone Encounter (Signed)
Pt has ov today 215

## 2011-06-30 ENCOUNTER — Other Ambulatory Visit: Payer: Self-pay | Admitting: Family Medicine

## 2011-07-02 NOTE — Telephone Encounter (Signed)
Done

## 2011-08-06 ENCOUNTER — Encounter: Payer: Self-pay | Admitting: Family Medicine

## 2011-08-06 ENCOUNTER — Ambulatory Visit (INDEPENDENT_AMBULATORY_CARE_PROVIDER_SITE_OTHER): Payer: 59 | Admitting: Family Medicine

## 2011-08-06 VITALS — BP 130/98 | HR 94 | Temp 99.0°F | Wt 273.0 lb

## 2011-08-06 DIAGNOSIS — I10 Essential (primary) hypertension: Secondary | ICD-10-CM

## 2011-08-06 DIAGNOSIS — K219 Gastro-esophageal reflux disease without esophagitis: Secondary | ICD-10-CM

## 2011-08-06 DIAGNOSIS — J45909 Unspecified asthma, uncomplicated: Secondary | ICD-10-CM

## 2011-08-06 MED ORDER — BECLOMETHASONE DIPROPIONATE 40 MCG/ACT IN AERS: 1.0000 | INHALATION_SPRAY | Freq: Two times a day (BID) | RESPIRATORY_TRACT | Status: DC

## 2011-08-06 MED ORDER — OMEPRAZOLE 20 MG PO CPDR: 20.0000 mg | DELAYED_RELEASE_CAPSULE | Freq: Every day | ORAL | Status: DC

## 2011-08-06 NOTE — Progress Notes (Signed)
  Subjective:    Patient ID: [name], male    DOB: 1975/02/16, 36 y.o.   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  He's had a history of reflux esophagitis and has been on an oral PPI in the past he's been having to take a lot of anti-inflammatories because of pain in his left shoulder. Dr. Thurston Hole  are fixed his right shoulder now his left shoulders hurting.  He takes Maxzide 1 daily for hypertension BP 130/98.    Review of Systems    general pulmonary cardiac and GI review of systems otherwise negative Objective:   Physical Exam Well-developed well-nourished male in no acute distress examination of the HEENT were negative neck was supple no adenopathy lungs showed late mild expiratory wheezing  Scar right shoulder from recent shoulder surgery  BP right arm sitting position 130/98     Assessment & Plan:  Asthma flare plan to start Qvar 1 puff twice a day  Left shoulder pain referred back to orthopedist  Hypertension continue Maxzide 1 daily BP check daily for 3 weeks return in 3 weeks for followup

## 2011-08-06 NOTE — Patient Instructions (Signed)
Begin the inhaled steroid 1 puff twice daily,,,,,,,,, swish and spit with mouthwash after each use  Check your blood pressure daily in the morning and continue to take the Maxzide 1 daily  Return in 3 weeks for followup with a record of volume blood pressure readings and the device  Omeprazole 1 daily for the reflux  Call you orthopedist today for followup on your left shoulder ASAP

## 2011-08-27 ENCOUNTER — Ambulatory Visit: Payer: 59 | Admitting: Family Medicine

## 2011-09-18 ENCOUNTER — Ambulatory Visit: Payer: 59 | Admitting: Family Medicine

## 2011-10-10 ENCOUNTER — Telehealth: Payer: Self-pay | Admitting: Family Medicine

## 2011-10-10 NOTE — Telephone Encounter (Signed)
Pt would like to switch from Dr Tawanna Cooler to Dr Caryl Never

## 2011-10-11 NOTE — Telephone Encounter (Addendum)
Pt wife has been notified per voice mail

## 2011-10-11 NOTE — Telephone Encounter (Signed)
Okay by me.

## 2011-10-11 NOTE — Telephone Encounter (Signed)
OK 

## 2011-11-08 ENCOUNTER — Encounter: Payer: Self-pay | Admitting: Family

## 2011-11-08 ENCOUNTER — Ambulatory Visit (INDEPENDENT_AMBULATORY_CARE_PROVIDER_SITE_OTHER): Payer: 59 | Admitting: Family

## 2011-11-08 VITALS — BP 140/100 | HR 67 | Temp 98.7°F | Wt 270.0 lb

## 2011-11-08 DIAGNOSIS — R059 Cough, unspecified: Secondary | ICD-10-CM

## 2011-11-08 DIAGNOSIS — J209 Acute bronchitis, unspecified: Secondary | ICD-10-CM

## 2011-11-08 DIAGNOSIS — R05 Cough: Secondary | ICD-10-CM

## 2011-11-08 DIAGNOSIS — J309 Allergic rhinitis, unspecified: Secondary | ICD-10-CM

## 2011-11-08 MED ORDER — PREDNISONE 20 MG PO TABS: ORAL_TABLET | ORAL | Status: AC

## 2011-11-08 MED ORDER — MONTELUKAST SODIUM 10 MG PO TABS: 10.0000 mg | ORAL_TABLET | Freq: Every day | ORAL | Status: DC
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
## 2011-11-08 NOTE — Patient Instructions (Addendum)

## 2011-11-12 ENCOUNTER — Other Ambulatory Visit: Payer: 59

## 2011-11-19 ENCOUNTER — Telehealth: Payer: Self-pay | Admitting: *Deleted

## 2011-11-19 ENCOUNTER — Encounter: Payer: 59 | Admitting: Family Medicine

## 2011-11-19 NOTE — Telephone Encounter (Signed)
[facility] No Show today, pt did have labs last month.  I attempted several times to call pt home, work and cell number, all numbers "no longer in service".  I called Sheralyn Boatman [facility]'s phone as emergency contact and left a message to have pt call our office to reschedule and update his contact information

## 2011-12-04 ENCOUNTER — Other Ambulatory Visit: Payer: 59

## 2011-12-11 ENCOUNTER — Encounter: Payer: 59 | Admitting: Family Medicine

## 2011-12-21 ENCOUNTER — Other Ambulatory Visit (INDEPENDENT_AMBULATORY_CARE_PROVIDER_SITE_OTHER): Payer: 59

## 2011-12-21 DIAGNOSIS — Z0000 Encounter for general adult medical examination without abnormal findings: Secondary | ICD-10-CM

## 2011-12-21 LAB — CBC WITH DIFFERENTIAL/PLATELET
Basophils Relative: 0.9 % (ref 0.0–3.0)
Eosinophils Relative: 8.1 % — ABNORMAL HIGH (ref 0.0–5.0)
HCT: 43.3 % (ref 39.0–52.0)
Hemoglobin: 14.3 g/dL (ref 13.0–17.0)
Lymphs Abs: 1.9 10*3/uL (ref 0.7–4.0)
MCV: 92.7 fl (ref 78.0–100.0)
Monocytes Absolute: 0.6 10*3/uL (ref 0.1–1.0)
Neutrophils Relative %: 47.5 % (ref 43.0–77.0)
RBC: 4.67 Mil/uL (ref 4.22–5.81)
WBC: 5.7 10*3/uL (ref 4.5–10.5)

## 2011-12-21 LAB — HEPATIC FUNCTION PANEL
ALT: 26 U/L (ref 0–53)
Bilirubin, Direct: 0 mg/dL (ref 0.0–0.3)
Total Protein: 7 g/dL (ref 6.0–8.3)

## 2011-12-21 LAB — POCT URINALYSIS DIPSTICK
Ketones, UA: NEGATIVE
Leukocytes, UA: NEGATIVE
Protein, UA: NEGATIVE
pH, UA: 6

## 2011-12-21 LAB — BASIC METABOLIC PANEL
Chloride: 103 mEq/L (ref 96–112)
Potassium: 4.2 mEq/L (ref 3.5–5.1)

## 2011-12-21 LAB — LIPID PANEL
HDL: 39 mg/dL — ABNORMAL LOW (ref >39.00)
LDL Cholesterol: 109 mg/dL — ABNORMAL HIGH (ref 0–99)
Total CHOL/HDL Ratio: 5
VLDL: 28.8 mg/dL (ref 0.0–40.0)

## 2011-12-28 ENCOUNTER — Ambulatory Visit (INDEPENDENT_AMBULATORY_CARE_PROVIDER_SITE_OTHER): Payer: 59 | Admitting: Family Medicine

## 2011-12-28 ENCOUNTER — Encounter: Payer: Self-pay | Admitting: Family Medicine

## 2011-12-28 VITALS — BP 140/88 | HR 80 | Temp 98.7°F | Resp 12 | Ht 75.75 in | Wt 260.0 lb

## 2011-12-28 DIAGNOSIS — J45909 Unspecified asthma, uncomplicated: Secondary | ICD-10-CM

## 2011-12-28 DIAGNOSIS — I10 Essential (primary) hypertension: Secondary | ICD-10-CM

## 2011-12-28 DIAGNOSIS — Z0000 Encounter for general adult medical examination without abnormal findings: Secondary | ICD-10-CM

## 2011-12-28 MED ORDER — TRAMADOL HCL 50 MG PO TABS
50.0000 mg | ORAL_TABLET | Freq: Four times a day (QID) | ORAL | Status: DC | PRN
Start: 2011-12-28 — End: 2012-05-08

## 2011-12-28 MED ORDER — PREDNISONE 10 MG PO TABS: ORAL_TABLET | ORAL | Status: DC

## 2011-12-28 MED ORDER — AMLODIPINE BESYLATE 5 MG PO TABS: 5.0000 mg | ORAL_TABLET | Freq: Every day | ORAL | Status: DC

## 2011-12-28 NOTE — Patient Instructions (Addendum)
Pulmicort 2 puffs twice daily and rinse mouth after use.
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
## 2012-02-22 ENCOUNTER — Ambulatory Visit (INDEPENDENT_AMBULATORY_CARE_PROVIDER_SITE_OTHER): Payer: 59 | Admitting: Family Medicine

## 2012-02-22 ENCOUNTER — Encounter: Payer: Self-pay | Admitting: Family Medicine

## 2012-02-22 VITALS — BP 130/98 | Temp 98.9°F | Wt 266.0 lb

## 2012-02-22 DIAGNOSIS — J45909 Unspecified asthma, uncomplicated: Secondary | ICD-10-CM

## 2012-02-22 DIAGNOSIS — M7580 Other shoulder lesions, unspecified shoulder: Secondary | ICD-10-CM

## 2012-02-22 DIAGNOSIS — M67919 Unspecified disorder of synovium and tendon, unspecified shoulder: Secondary | ICD-10-CM

## 2012-02-22 DIAGNOSIS — I10 Essential (primary) hypertension: Secondary | ICD-10-CM

## 2012-02-22 MED ORDER — BUDESONIDE 180 MCG/ACT IN AEPB: 2.0000 | INHALATION_SPRAY | Freq: Two times a day (BID) | RESPIRATORY_TRACT | Status: DC
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
## 2012-02-22 NOTE — Patient Instructions (Signed)
Get back on DAILY use of Amlodipine. Get back on Pulmicort 2 puffs twice daily.

## 2012-04-03 ENCOUNTER — Encounter: Payer: Self-pay | Admitting: Family Medicine

## 2012-04-03 ENCOUNTER — Ambulatory Visit (INDEPENDENT_AMBULATORY_CARE_PROVIDER_SITE_OTHER): Payer: 59 | Admitting: Family Medicine

## 2012-04-03 VITALS — BP 124/90 | HR 92 | Temp 98.9°F | Wt 265.0 lb

## 2012-04-03 DIAGNOSIS — J45901 Unspecified asthma with (acute) exacerbation: Secondary | ICD-10-CM

## 2012-04-03 MED ORDER — HYDROCODONE-HOMATROPINE 5-1.5 MG/5ML PO SYRP: 5.0000 mL | ORAL_SOLUTION | Freq: Three times a day (TID) | ORAL | Status: DC | PRN

## 2012-04-03 MED ORDER — AZITHROMYCIN 250 MG PO TABS: ORAL_TABLET | ORAL | Status: DC

## 2012-04-03 MED ORDER — PREDNISONE 20 MG PO TABS: ORAL_TABLET | ORAL | Status: DC

## 2012-04-03 NOTE — Progress Notes (Signed)
Chief Complaint  Patient presents with  . Hoarse    dry cough, heaviness in chest, body ahces, chills, sinus pressure and pain     HPI:  Sinus congestion: -started about 1 week ago -symptoms: sinus congestion, subjective fever, sore throat, cough, body aches, chills - was having mild SOB but this has improved -denies:fevers, NVD -tried: OTC cough and cold medication, Nyquil -sick contacts: none known -asthma: take pulmicort every day for ? Asthma, also uses singular and uses albuterol rarely  ROS: See pertinent positives and negatives per HPI.  Past Medical History  Diagnosis Date  . Anxiety   . Hypertension   . Allergy   . Chronic headaches   . GERD (gastroesophageal reflux disease)     Family History  Problem Relation Age of Onset  . Cancer Maternal Grandmother   . Diabetes Other   . Hypertension Other   . Hyperlipidemia Other     History   Social History  . Marital Status: Legally Separated    Spouse Name: N/A    Number of Children: N/A  . Years of Education: N/A   Social History Main Topics  . Smoking status: Never Smoker   . Smokeless tobacco: None  . Alcohol Use: No  . Drug Use: No  . Sexually Active:    Other Topics Concern  . None   Social History Narrative  . None    Current outpatient prescriptions:amLODipine (NORVASC) 5 MG tablet, Take 1 tablet (5 mg total) by mouth daily., Disp: 30 tablet, Rfl: 11;  budesonide (PULMICORT FLEXHALER) 180 MCG/ACT inhaler, Inhale 2 puffs into the lungs 2 (two) times daily., Disp: 1 Inhaler, Rfl: 11;  omeprazole (PRILOSEC) 20 MG capsule, Take 1 capsule (20 mg total) by mouth daily., Disp: 100 capsule, Rfl: 3 albuterol (PROVENTIL,VENTOLIN) 90 MCG/ACT inhaler, One puff 15 minutes prior to exercise, Disp: 17 g, Rfl: 2;  azithromycin (ZITHROMAX) 250 MG tablet, 2 tabs today then 1 tab daily, Disp: 6 each, Rfl: 0;  HYDROcodone-homatropine (HYCODAN) 5-1.5 MG/5ML syrup, Take 5 mLs by mouth every 8 (eight) hours as needed for  cough., Disp: 120 mL, Rfl: 0;  predniSONE (DELTASONE) 20 MG tablet, 40mg  (2 tablets) dialy for 5 days, Disp: 8 tablet, Rfl: 0 sertraline (ZOLOFT) 50 MG tablet, TAKE 1 TABLET BY MOUTH EVERY DAY, Disp: 30 tablet, Rfl: 2;  traMADol (ULTRAM) 50 MG tablet, Take 1 tablet (50 mg total) by mouth every 6 (six) hours as needed for pain., Disp: 120 tablet, Rfl: 1  EXAM:  Filed Vitals:   04/03/12 0923  BP: 124/90  Pulse: 92  Temp: 98.9 F (37.2 C)    Body mass index is 32.47 kg/(m^2).  GENERAL: vitals reviewed and listed above, alert, oriented, appears well hydrated and in no acute distress  HEENT: atraumatic, conjunttiva clear, no obvious abnormalities on inspection of external nose and ears, normal appearance of ear canals and TMs, clear nasal congestion, mild post oropharyngeal erythema with PND, no tonsillar edema or exudate, no sinus TTP  NECK: no obvious masses on inspection  LUNGS: clear to auscultation bilaterally, no wheezes, rales or rhonchi, good air movement  CV: HRRR, no peripheral edema  MS: moves all extremities without noticeable abnormality  PSYCH: pleasant and cooperative, no obvious depression or anxiety  ASSESSMENT AND PLAN:  Discussed the following assessment and plan:  Asthma with acute exacerbation - Plan: predniSONE (DELTASONE) 20 MG tablet  Upper respiratory infection - Plan: HYDROcodone-homatropine (HYCODAN) 5-1.5 MG/5ML syrup, azithromycin (ZITHROMAX) 250 MG tablet  -lung exam  normal and seems more upper resp - but with asthma and recen tSOB chest tightness givning prednisone burst and abx for if worsens or not improving - risks discussed. Cough med risks discussed. Return and ED precautions discussed. -Patient advised to return or notify a doctor immediately if symptoms worsen or persist or new concerns arise.  There are no Patient Instructions on file for this visit.   Kriste Basque R.

## 2012-05-08 ENCOUNTER — Other Ambulatory Visit: Payer: Self-pay | Admitting: Family Medicine

## 2012-05-16 ENCOUNTER — Ambulatory Visit: Payer: 59 | Admitting: Family Medicine

## 2012-05-16 DIAGNOSIS — Z0289 Encounter for other administrative examinations: Secondary | ICD-10-CM

## 2012-05-30 ENCOUNTER — Ambulatory Visit: Payer: Self-pay | Admitting: Emergency Medicine

## 2012-05-30 VITALS — BP 126/83 | HR 97 | Temp 98.2°F | Resp 17 | Ht 76.0 in | Wt 260.0 lb

## 2012-05-30 DIAGNOSIS — Z0289 Encounter for other administrative examinations: Secondary | ICD-10-CM

## 2012-06-05 ENCOUNTER — Encounter: Payer: Self-pay | Admitting: Family Medicine

## 2012-06-05 ENCOUNTER — Ambulatory Visit (INDEPENDENT_AMBULATORY_CARE_PROVIDER_SITE_OTHER): Payer: 59 | Admitting: Family Medicine

## 2012-06-05 VITALS — BP 122/72 | Temp 98.4°F

## 2012-06-05 DIAGNOSIS — J069 Acute upper respiratory infection, unspecified: Secondary | ICD-10-CM

## 2012-06-05 MED ORDER — HYDROCODONE-HOMATROPINE 5-1.5 MG/5ML PO SYRP: 5.0000 mL | ORAL_SOLUTION | Freq: Four times a day (QID) | ORAL | Status: AC | PRN

## 2012-06-05 NOTE — Progress Notes (Signed)
  Subjective:    Patient ID: [name], male    DOB: [birth year], 37 y.o.   MRN: [id number]  HPI Onset last Friday of nasal congestion, cough, fatigue No fever.  Cough is worst symptom. Especially bothersome at night and not relieved with Mucinex. Patient is nonsmoker. Does have history of asthma. Pulmicort has definitely helped his asthma symptoms ? Wheezing off and on.  On Pulmicort.   Denies nausea, vomiting, or diarrhea.   Review of Systems  Constitutional: Positive for fatigue. Negative for fever and chills.  HENT: Positive for congestion. Negative for sore throat.   Respiratory: Positive for cough.   Cardiovascular: Negative for chest pain.  Musculoskeletal: Positive for myalgias.  Neurological: Positive for headaches.       Objective:   Physical Exam  Constitutional: He appears well-developed and well-nourished.  HENT:  Right Ear: External ear normal.  Left Ear: External ear normal.  Mouth/Throat: Oropharynx is clear and moist.  Neck: Neck supple.  Cardiovascular: Normal rate and regular rhythm.   Pulmonary/Chest: Effort normal and breath sounds normal. No respiratory distress. He has no wheezes. He has no rales.  Lymphadenopathy:    He has no cervical adenopathy.          Assessment & Plan:  Viral URI with associated cough. Hycodan cough syrup for as needed use at night. Continue Pulmicort and as needed albuterol. Work note provided for today and tomorrow

## 2012-06-05 NOTE — Patient Instructions (Addendum)
Viral Syndrome  You or your child has Viral Syndrome. It is the most common infection causing "colds" and infections in the nose, throat, sinuses, and breathing tubes. Sometimes the infection causes nausea, vomiting, or diarrhea. The germ that causes the infection is a virus. No antibiotic or other medicine will kill it. There are medicines that you can take to make you or your child more comfortable.   HOME CARE INSTRUCTIONS    Rest in bed until you start to feel better.   If you have diarrhea or vomiting, eat small amounts of crackers and toast. Soup is helpful.   Do not give aspirin or medicine that contains aspirin to children.   Only take over-the-counter or prescription medicines for pain, discomfort, or fever as directed by your caregiver.  SEEK IMMEDIATE MEDICAL CARE IF:    You or your child has not improved within one week.   You or your child has pain that is not at least partially relieved by over-the-counter medicine.   Thick, colored mucus or blood is coughed up.   Discharge from the nose becomes thick yellow or green.   Diarrhea or vomiting gets worse.   There is any major change in your or your child's condition.   You or your child develops a skin rash, stiff neck, severe headache, or are unable to hold down food or fluid.   You or your child has an oral temperature above 102 F (38.9 C), not controlled by medicine.   Your baby is older than 3 months with a rectal temperature of 102 F (38.9 C) or higher.   Your baby is 3 months old or younger with a rectal temperature of 100.4 F (38 C) or higher.  Document Released: 12/24/2005 Document Revised: 04/02/2011 Document Reviewed: 12/25/2006  Exit[employer] Patient Information 2013 [employer].

## 2012-06-07 ENCOUNTER — Encounter: Payer: Self-pay | Admitting: Emergency Medicine

## 2012-07-13 NOTE — Progress Notes (Signed)
  Subjective:    Patient ID: Suszanne Finch, male    DOB: December 28, 1975, 37 y.o.   MRN: [id number]  HPI  preemployment physical  Review of Systems    As per HPI, otherwise negative.   Objective:   Physical Exam  GEN: WDWN, NAD, Non-toxic, A & O x 3 HEENT: Atraumatic, Normocephalic. Neck supple. No masses, No LAD. Ears and Nose: No external deformity. CV: RRR, No M/G/R. No JVD. No thrill. No extra heart sounds. PULM: CTA B, no wheezes, crackles, rhonchi. No retractions. No resp. distress. No accessory muscle use. ABD: S, NT, ND, +BS. No rebound. No HSM. EXTR: No c/c/e NEURO Normal gait.  PSYCH: Normally interactive. Conversant. Not depressed or anxious appearing.  Calm demeanor.         Assessment & Plan:   fit

## 2012-07-18 ENCOUNTER — Ambulatory Visit: Payer: 59 | Admitting: Family Medicine

## 2012-08-01 ENCOUNTER — Ambulatory Visit (INDEPENDENT_AMBULATORY_CARE_PROVIDER_SITE_OTHER): Payer: 59 | Admitting: Family Medicine

## 2012-08-01 ENCOUNTER — Encounter: Payer: Self-pay | Admitting: Family Medicine

## 2012-08-01 VITALS — BP 132/80 | HR 100 | Temp 98.3°F | Ht 76.0 in | Wt 270.0 lb

## 2012-08-01 DIAGNOSIS — J45909 Unspecified asthma, uncomplicated: Secondary | ICD-10-CM

## 2012-08-01 DIAGNOSIS — M67919 Unspecified disorder of synovium and tendon, unspecified shoulder: Secondary | ICD-10-CM

## 2012-08-01 DIAGNOSIS — M75102 Unspecified rotator cuff tear or rupture of left shoulder, not specified as traumatic: Secondary | ICD-10-CM

## 2012-08-01 DIAGNOSIS — J4540 Moderate persistent asthma, uncomplicated: Secondary | ICD-10-CM

## 2012-08-01 MED ORDER — TRAMADOL HCL 50 MG PO TABS: 50.0000 mg | ORAL_TABLET | Freq: Four times a day (QID) | ORAL | Status: DC | PRN

## 2012-08-01 NOTE — Patient Instructions (Addendum)
Rotator Cuff Tendonitis  The rotator cuff is the collection of all the muscles and tendons (the supraspinatus, infraspinatus, subscapularis, and teres minor muscles and their tendons) that help your shoulder stay in place. This unit holds the head of the upper arm bone (humerus) in the cup (fossa) of the shoulder blade (scapula). Basically, it connects the arm to the shoulder. Tendinitis is a swelling and irritation of the tissue, called cord like structures (tendons) that connect muscle to bone. It usually is caused by overusing the joint involved. When the tissue surrounding a tendon (the synovium) becomes inflamed, it is called tenosynovitis. This also is often the result of overuse in people whose jobs require repetitive (over and over again) types of motion. HOME CARE INSTRUCTIONS   Use a sling or splint for as long as directed by your caregiver until the pain decreases.  Apply ice to the injury for 15-20 minutes, 3-4 times per day. Put the ice in a plastic bag and place a towel between the bag of ice and your skin.  Try to avoid use other than gentle range of motion while your shoulder is painful. Use and exercise only as directed by your caregiver. Stop exercises or range of motion if pain or discomfort increases, unless directed otherwise by your caregiver.  Only take over-the-counter or prescription medicines for pain, discomfort, or fever as directed by your caregiver.  If you were give a shoulder sling and straps (immobilizer), do not remove it except as directed, or until you see a caregiver for a follow-up examination. If you need to remove it, move your arm as little as possible or as directed.  You may want to sleep on several pillows at night to lessen swelling and pain. SEEK IMMEDIATE MEDICAL CARE IF:   Pain in your shoulder increases or new pain develops in your arm, hand, or fingers and is not relieved with medications.  You develop new, unexplained symptoms, especially  increased numbness in the hands or loss of strength, or you develop any worsening of the problems which brought you in for care.  Your arm, hand, or fingers are numb or tingling.  Your arm, hand, or fingers are swollen, painful, or turn white or blue. Document Released: 03/31/2003 Document Revised: 04/02/2011 Document Reviewed: 11/06/2007 Exit[employer] Patient Information 2014 [employer].  
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
## 2012-11-07 ENCOUNTER — Telehealth: Payer: Self-pay | Admitting: Family Medicine

## 2012-11-07 DIAGNOSIS — J45909 Unspecified asthma, uncomplicated: Secondary | ICD-10-CM

## 2012-11-07 NOTE — Telephone Encounter (Signed)
Pt requesting refill of:  traMADol (ULTRAM) 50 MG tablet albuterol (PROVENTIL,VENTOLIN) 90 MCG/ACT inhaler budesonide (PULMICORT FLEXHALER) 180 MCG/ACT inhaler  Sent to [facility]

## 2012-11-07 NOTE — Telephone Encounter (Signed)
Tramadol Last refill 08/01/12 #120 1 refill Last visit 08/01/12

## 2012-11-09 NOTE — Telephone Encounter (Signed)
Refill pulmicort for one year. Refill others for 3 months.

## 2012-11-10 MED ORDER — BUDESONIDE 180 MCG/ACT IN AEPB: 2.0000 | INHALATION_SPRAY | Freq: Two times a day (BID) | RESPIRATORY_TRACT | Status: DC

## 2012-11-10 MED ORDER — TRAMADOL HCL 50 MG PO TABS: 50.0000 mg | ORAL_TABLET | Freq: Four times a day (QID) | ORAL | Status: DC | PRN

## 2012-11-10 NOTE — Telephone Encounter (Signed)
Rx sent to pharmacy   

## 2013-01-30 ENCOUNTER — Telehealth: Payer: Self-pay | Admitting: Family Medicine

## 2013-01-30 NOTE — Telephone Encounter (Signed)
Pt needs a sample of pulmicort inhaler. Pt is waiting for new ins cards

## 2013-01-30 NOTE — Telephone Encounter (Signed)
Informed pt that we are out of samples of the pulmicort inhaler at this time.

## 2013-06-12 ENCOUNTER — Ambulatory Visit (INDEPENDENT_AMBULATORY_CARE_PROVIDER_SITE_OTHER): Payer: 59 | Admitting: Family Medicine

## 2013-06-12 ENCOUNTER — Telehealth: Payer: Self-pay | Admitting: Family Medicine

## 2013-06-12 ENCOUNTER — Encounter: Payer: Self-pay | Admitting: Family Medicine

## 2013-06-12 ENCOUNTER — Ambulatory Visit (INDEPENDENT_AMBULATORY_CARE_PROVIDER_SITE_OTHER)
Admission: RE | Admit: 2013-06-12 | Discharge: 2013-06-12 | Disposition: A | Discharge status: Home / Self Care | Payer: 59 | Source: Ambulatory Visit | Attending: Family Medicine | Admitting: Family Medicine

## 2013-06-12 VITALS — BP 134/82 | HR 96 | Wt 269.0 lb

## 2013-06-12 DIAGNOSIS — R0789 Other chest pain: Secondary | ICD-10-CM

## 2013-06-12 DIAGNOSIS — R071 Chest pain on breathing: Secondary | ICD-10-CM

## 2013-06-12 MED ORDER — TRAMADOL HCL 50 MG PO TABS: 50.0000 mg | ORAL_TABLET | Freq: Four times a day (QID) | ORAL | Status: DC | PRN

## 2013-06-12 NOTE — Telephone Encounter (Signed)
Pt aware that RX is ready for pick up  

## 2013-06-12 NOTE — Telephone Encounter (Signed)
refill once. 

## 2013-06-12 NOTE — Telephone Encounter (Signed)
Last visit 08/01/12 Last refill 11/10/12 #120 1 refill

## 2013-06-12 NOTE — Progress Notes (Signed)
Pre visit review using our clinic review tool, if applicable. No additional management support is needed unless otherwise documented below in the visit note. 
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
## 2013-06-12 NOTE — Telephone Encounter (Signed)
Pt is needing new rx traMADol (ULTRAM) 50 MG tablet, please call when available for pick up ° °

## 2013-08-13 ENCOUNTER — Telehealth: Payer: Self-pay | Admitting: Family Medicine

## 2013-08-13 MED ORDER — TRAMADOL HCL 50 MG PO TABS: 50.0000 mg | ORAL_TABLET | Freq: Four times a day (QID) | ORAL | Status: DC | PRN

## 2013-08-13 NOTE — Telephone Encounter (Signed)
Called Rx in to the pharmacy

## 2013-08-13 NOTE — Telephone Encounter (Signed)
Last visit 06/12/13 Last refill 06/12/13 #120 0 refill

## 2013-08-13 NOTE — Telephone Encounter (Signed)
Pt needs refill on tramadol 50 mg call [facility]

## 2013-08-13 NOTE — Telephone Encounter (Signed)
Refill OK

## 2013-08-26 ENCOUNTER — Telehealth: Payer: Self-pay | Admitting: Family Medicine

## 2013-08-26 NOTE — Telephone Encounter (Signed)
Pt stated that he is having that chest pain again and the meloxicam helped. The RX is not currently on patient med list and it was never prescribed to patient from you. Pt stated that he had the RX in the past and it has helped him.

## 2013-08-26 NOTE — Telephone Encounter (Signed)
Pt request a rx  of the following:    meloxicam   Phamacy:  [facility]

## 2013-08-28 MED ORDER — MELOXICAM 15 MG PO TABS: 15.0000 mg | ORAL_TABLET | Freq: Every day | ORAL | Status: DC

## 2013-08-28 NOTE — Telephone Encounter (Signed)
RX sent to pharmacy  

## 2013-08-28 NOTE — Telephone Encounter (Signed)
OK to refill Mobic 15 mg once daily #30 with one refill.

## 2013-10-06 ENCOUNTER — Telehealth: Payer: Self-pay | Admitting: Family Medicine

## 2013-10-06 NOTE — Telephone Encounter (Signed)
Pt request refill of the following: traMADol (ULTRAM) 50 MG tablet         Phamacy:  [facility]

## 2013-10-06 NOTE — Telephone Encounter (Signed)
Last visit 06/12/13 Last refill 08/13/13 #120 0 refill

## 2013-10-06 NOTE — Telephone Encounter (Signed)
Refill OK

## 2013-10-07 MED ORDER — TRAMADOL HCL 50 MG PO TABS: 50.0000 mg | ORAL_TABLET | Freq: Four times a day (QID) | ORAL | Status: DC | PRN

## 2013-10-07 NOTE — Telephone Encounter (Signed)
Rx called in to pharmacy. 

## 2013-10-13 ENCOUNTER — Encounter: Payer: Self-pay | Admitting: *Deleted

## 2013-10-13 ENCOUNTER — Other Ambulatory Visit (INDEPENDENT_AMBULATORY_CARE_PROVIDER_SITE_OTHER): Payer: 59

## 2013-10-13 DIAGNOSIS — Z0000 Encounter for general adult medical examination without abnormal findings: Secondary | ICD-10-CM

## 2013-10-13 LAB — POCT URINALYSIS DIPSTICK
Bilirubin, UA: NEGATIVE
Blood, UA: NEGATIVE
Glucose, UA: NEGATIVE
KETONES UA: NEGATIVE
Leukocytes, UA: NEGATIVE
Nitrite, UA: NEGATIVE
PROTEIN UA: NEGATIVE
SPEC GRAV UA: 1.015
UROBILINOGEN UA: 0.2
pH, UA: 7.5

## 2013-10-13 LAB — BASIC METABOLIC PANEL
BUN: 14 mg/dL (ref 6–23)
CHLORIDE: 105 meq/L (ref 96–112)
CO2: 24 mEq/L (ref 19–32)
Calcium: 9.4 mg/dL (ref 8.4–10.5)
Creatinine, Ser: 1.3 mg/dL (ref 0.4–1.5)
GFR: 82.23 mL/min (ref >60.00)
Glucose, Bld: 98 mg/dL (ref 70–99)
POTASSIUM: 4.2 meq/L (ref 3.5–5.1)
SODIUM: 136 meq/L (ref 135–145)

## 2013-10-13 LAB — PSA: PSA: 0.44 ng/mL (ref 0.10–4.00)

## 2013-10-13 LAB — LIPID PANEL
CHOL/HDL RATIO: 5
Cholesterol: 182 mg/dL (ref 0–200)
HDL: 39.3 mg/dL (ref >39.00)
LDL Cholesterol: 120 mg/dL — ABNORMAL HIGH (ref 0–99)
NonHDL: 142.7
TRIGLYCERIDES: 116 mg/dL (ref 0.0–149.0)
VLDL: 23.2 mg/dL (ref 0.0–40.0)

## 2013-10-13 LAB — CBC WITH DIFFERENTIAL/PLATELET
BASOS PCT: 0.7 % (ref 0.0–3.0)
Basophils Absolute: 0 10*3/uL (ref 0.0–0.1)
Eosinophils Absolute: 0.5 10*3/uL (ref 0.0–0.7)
Eosinophils Relative: 8.9 % — ABNORMAL HIGH (ref 0.0–5.0)
HCT: 40.9 % (ref 39.0–52.0)
HEMOGLOBIN: 13.8 g/dL (ref 13.0–17.0)
LYMPHS ABS: 1.5 10*3/uL (ref 0.7–4.0)
Lymphocytes Relative: 28.5 % (ref 12.0–46.0)
MCHC: 33.8 g/dL (ref 30.0–36.0)
MCV: 90.7 fl (ref 78.0–100.0)
MONO ABS: 0.5 10*3/uL (ref 0.1–1.0)
MONOS PCT: 10.4 % (ref 3.0–12.0)
Neutro Abs: 2.7 10*3/uL (ref 1.4–7.7)
Neutrophils Relative %: 51.5 % (ref 43.0–77.0)
Platelets: 301 10*3/uL (ref 150.0–400.0)
RBC: 4.51 Mil/uL (ref 4.22–5.81)
RDW: 13.8 % (ref 11.5–15.5)
WBC: 5.2 10*3/uL (ref 4.0–10.5)

## 2013-10-13 LAB — TSH: TSH: 0.45 u[IU]/mL (ref 0.35–4.50)

## 2013-10-13 LAB — HEPATIC FUNCTION PANEL
ALBUMIN: 4.6 g/dL (ref 3.5–5.2)
ALK PHOS: 85 U/L (ref 39–117)
ALT: 41 U/L (ref 0–53)
AST: 34 U/L (ref 0–37)
BILIRUBIN DIRECT: 0.1 mg/dL (ref 0.0–0.3)
Total Bilirubin: 0.5 mg/dL (ref 0.2–1.2)
Total Protein: 7.5 g/dL (ref 6.0–8.3)

## 2013-10-20 ENCOUNTER — Encounter: Payer: Self-pay | Admitting: Family Medicine

## 2013-10-20 ENCOUNTER — Ambulatory Visit (INDEPENDENT_AMBULATORY_CARE_PROVIDER_SITE_OTHER): Payer: 59 | Admitting: Family Medicine

## 2013-10-20 VITALS — BP 134/80 | HR 80 | Temp 98.0°F | Wt 272.0 lb

## 2013-10-20 DIAGNOSIS — Z23 Encounter for immunization: Secondary | ICD-10-CM

## 2013-10-20 DIAGNOSIS — Z0000 Encounter for general adult medical examination without abnormal findings: Secondary | ICD-10-CM

## 2013-10-20 DIAGNOSIS — J45909 Unspecified asthma, uncomplicated: Secondary | ICD-10-CM

## 2013-10-20 DIAGNOSIS — M67919 Unspecified disorder of synovium and tendon, unspecified shoulder: Secondary | ICD-10-CM

## 2013-10-20 DIAGNOSIS — E669 Obesity, unspecified: Secondary | ICD-10-CM | POA: Insufficient documentation

## 2013-10-20 DIAGNOSIS — M75102 Unspecified rotator cuff tear or rupture of left shoulder, not specified as traumatic: Secondary | ICD-10-CM

## 2013-10-20 DIAGNOSIS — M719 Bursopathy, unspecified: Secondary | ICD-10-CM

## 2013-10-20 MED ORDER — METHYLPREDNISOLONE ACETATE 40 MG/ML IJ SUSP
40.0000 mg | Freq: Once | INTRAMUSCULAR | Status: AC
  Administered 2013-10-20: 40 mg via INTRAMUSCULAR

## 2013-10-20 MED ORDER — BUDESONIDE-FORMOTEROL FUMARATE 160-4.5 MCG/ACT IN AERO: 2.0000 | INHALATION_SPRAY | Freq: Two times a day (BID) | RESPIRATORY_TRACT | Status: DC

## 2013-10-20 MED ORDER — PREDNISONE 10 MG PO TABS: ORAL_TABLET | ORAL | Status: DC

## 2013-10-20 MED ORDER — ALBUTEROL SULFATE HFA 108 (90 BASE) MCG/ACT IN AERS: 2.0000 | INHALATION_SPRAY | RESPIRATORY_TRACT | Status: DC | PRN
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
## 2013-10-20 NOTE — Progress Notes (Signed)
Pre visit review using our clinic review tool, if applicable. No additional management support is needed unless otherwise documented below in the visit note. 

## 2013-10-21 ENCOUNTER — Telehealth: Payer: Self-pay

## 2013-10-21 NOTE — Telephone Encounter (Signed)
Insurance is not covering Symbicort: Alts are Alvesco, Dulera, or Advair Diskus  [facility]

## 2013-10-21 NOTE — Telephone Encounter (Signed)
Let's use Advair 100 mg ONE puff BID

## 2013-10-22 MED ORDER — FLUTICASONE-SALMETEROL 100-50 MCG/DOSE IN AEPB
1.0000 | INHALATION_SPRAY | Freq: Two times a day (BID) | RESPIRATORY_TRACT | Status: DC
Start: 2013-10-22 — End: 2014-03-15

## 2013-10-22 NOTE — Telephone Encounter (Signed)
RX sent to pharmacy  

## 2013-10-22 NOTE — Addendum Note (Signed)
Addended by: Noe Gens E on: 10/22/2013 09:30 AM   Modules accepted: Orders

## 2013-10-27 ENCOUNTER — Encounter: Payer: Self-pay | Admitting: Family Medicine

## 2013-11-19 ENCOUNTER — Ambulatory Visit (INDEPENDENT_AMBULATORY_CARE_PROVIDER_SITE_OTHER): Payer: 59 | Admitting: Family Medicine

## 2013-11-19 VITALS — BP 124/80 | HR 107 | Temp 98.8°F | Resp 18 | Ht 75.0 in | Wt 271.0 lb

## 2013-11-19 DIAGNOSIS — M62830 Muscle spasm of back: Secondary | ICD-10-CM

## 2013-11-19 MED ORDER — CYCLOBENZAPRINE HCL 10 MG PO TABS: 10.0000 mg | ORAL_TABLET | Freq: Three times a day (TID) | ORAL | Status: DC | PRN

## 2013-11-19 NOTE — Patient Instructions (Addendum)
Your symptoms are most likely from a muscle spasm you have in the muscles on the right side of your low back.  Not concerning for a slipped disc. Please take the flexeril every 8 hours as needed for the pain/spasm. Remember this medication will make you sleepy so use caution with driving or operating heavy machinery.  Heat/ice the area and take ibuprofen 400 mg every 6 hours as needed for pain and inflammation.  Do these stretches twice per day to help stretch those tight muscles and prevent this from recurring in the future.   Low Back Strain with Rehab A strain is an injury in which a tendon or muscle is torn. The muscles and tendons of the lower back are vulnerable to strains. However, these muscles and tendons are very strong and require a great force to be injured. Strains are classified into three categories. Grade 1 strains cause pain, but the tendon is not lengthened. Grade 2 strains include a lengthened ligament, due to the ligament being stretched or partially ruptured. With grade 2 strains there is still function, although the function may be decreased. Grade 3 strains involve a complete tear of the tendon or muscle, and function is usually impaired. SYMPTOMS   Pain in the lower back.  Pain that affects one side more than the other.  Pain that gets worse with movement and may be felt in the hip, buttocks, or back of the thigh.  Muscle spasms of the muscles in the back.  Swelling along the muscles of the back.  Loss of strength of the back muscles.  Crackling sound (crepitation) when the muscles are touched. CAUSES  Lower back strains occur when a force is placed on the muscles or tendons that is greater than they can handle. Common causes of injury include:  Prolonged overuse of the muscle-tendon units in the lower back, usually from incorrect posture.  A single violent injury or force applied to the back. RISK INCREASES WITH:  Sports that involve twisting forces on the  spine or a lot of bending at the waist (football, rugby, weightlifting, bowling, golf, tennis, speed skating, racquetball, swimming, running, gymnastics, diving).  Poor strength and flexibility.  Failure to warm up properly before activity.  Family history of lower back pain or disk disorders.  Previous back injury or surgery (especially fusion).  Poor posture with lifting, especially heavy objects.  Prolonged sitting, especially with poor posture. PREVENTION   Learn and use proper posture when sitting or lifting (maintain proper posture when sitting, lift using the knees and legs, not at the waist).  Warm up and stretch properly before activity.  Allow for adequate recovery between workouts.  Maintain physical fitness:  Strength, flexibility, and endurance.  Cardiovascular fitness. PROGNOSIS  If treated properly, lower back strains usually heal within 6 weeks. RELATED COMPLICATIONS   Recurring symptoms, resulting in a chronic problem.  Chronic inflammation, scarring, and partial muscle-tendon tear.  Delayed healing or resolution of symptoms.  Prolonged disability. TREATMENT  Treatment first involves the use of ice and medicine, to reduce pain and inflammation. The use of strengthening and stretching exercises may help reduce pain with activity. These exercises may be performed at home or with a therapist. Severe injuries may require referral to a therapist for further evaluation and treatment, such as ultrasound. Your caregiver may advise that you wear a back brace or corset, to help reduce pain and discomfort. Often, prolonged bed rest results in greater harm then benefit. Corticosteroid injections may be recommended. However,  these should be reserved for the most serious cases. It is important to avoid using your back when lifting objects. At night, sleep on your back on a firm mattress with a pillow placed under your knees. If non-surgical treatment is unsuccessful, surgery  may be needed.  MEDICATION   If pain medicine is needed, nonsteroidal anti-inflammatory medicines (aspirin and ibuprofen), or other minor pain relievers (acetaminophen), are often advised.  Do not take pain medicine for 7 days before surgery.  Prescription pain relievers may be given, if your caregiver thinks they are needed. Use only as directed and only as much as you need.  Ointments applied to the skin may be helpful.  Corticosteroid injections may be given by your caregiver. These injections should be reserved for the most serious cases, because they may only be given a certain number of times. HEAT AND COLD  Cold treatment (icing) should be applied for 10 to 15 minutes every 2 to 3 hours for inflammation and pain, and immediately after activity that aggravates your symptoms. Use ice packs or an ice massage.  Heat treatment may be used before performing stretching and strengthening activities prescribed by your caregiver, physical therapist, or athletic trainer. Use a heat pack or a warm water soak. SEEK MEDICAL CARE IF:   Symptoms get worse or do not improve in 2 to 4 weeks, despite treatment.  You develop numbness, weakness, or loss of bowel or bladder function.  New, unexplained symptoms develop. (Drugs used in treatment may produce side effects.) EXERCISES  RANGE OF MOTION (ROM) AND STRETCHING EXERCISES - Low Back Strain Most people with lower back pain will find that their symptoms get worse with excessive bending forward (flexion) or arching at the lower back (extension). The exercises which will help resolve your symptoms will focus on the opposite motion.  Your physician, physical therapist or athletic trainer will help you determine which exercises will be most helpful to resolve your lower back pain. Do not complete any exercises without first consulting with your caregiver. Discontinue any exercises which make your symptoms worse until you speak to your caregiver.  If you  have pain, numbness or tingling which travels down into your buttocks, leg or foot, the goal of the therapy is for these symptoms to move closer to your back and eventually resolve. Sometimes, these leg symptoms will get better, but your lower back pain may worsen. This is typically an indication of progress in your rehabilitation. Be very alert to any changes in your symptoms and the activities in which you participated in the 24 hours prior to the change. Sharing this information with your caregiver will allow him/her to most efficiently treat your condition.  These exercises may help you when beginning to rehabilitate your injury. Your symptoms may resolve with or without further involvement from your physician, physical therapist or athletic trainer. While completing these exercises, remember:  Restoring tissue flexibility helps normal motion to return to the joints. This allows healthier, less painful movement and activity.  An effective stretch should be held for at least 30 seconds.  A stretch should never be painful. You should only feel a gentle lengthening or release in the stretched tissue. FLEXION RANGE OF MOTION AND STRETCHING EXERCISES: STRETCH - Flexion, Single Knee to Chest   Lie on a firm bed or floor with both legs extended in front of you.  Keeping one leg in contact with the floor, bring your opposite knee to your chest. Hold your leg in place by either  grabbing behind your thigh or at your knee.  Pull until you feel a gentle stretch in your lower back. Hold __________ seconds.  Slowly release your grasp and repeat the exercise with the opposite side. Repeat __________ times. Complete this exercise __________ times per day.  STRETCH - Flexion, Double Knee to Chest   Lie on a firm bed or floor with both legs extended in front of you.  Keeping one leg in contact with the floor, bring your opposite knee to your chest.  Tense your stomach muscles to support your back and  then lift your other knee to your chest. Hold your legs in place by either grabbing behind your thighs or at your knees.  Pull both knees toward your chest until you feel a gentle stretch in your lower back. Hold __________ seconds.  Tense your stomach muscles and slowly return one leg at a time to the floor. Repeat __________ times. Complete this exercise __________ times per day.  STRETCH - Low Trunk Rotation  Lie on a firm bed or floor. Keeping your legs in front of you, bend your knees so they are both pointed toward the ceiling and your feet are flat on the floor.  Extend your arms out to the side. This will stabilize your upper body by keeping your shoulders in contact with the floor.  Gently and slowly drop both knees together to one side until you feel a gentle stretch in your lower back. Hold for __________ seconds.  Tense your stomach muscles to support your lower back as you bring your knees back to the starting position. Repeat the exercise to the other side. Repeat __________ times. Complete this exercise __________ times per day  EXTENSION RANGE OF MOTION AND FLEXIBILITY EXERCISES: STRETCH - Extension, Prone on Elbows   Lie on your stomach on the floor, a bed will be too soft. Place your palms about shoulder width apart and at the height of your head.  Place your elbows under your shoulders. If this is too painful, stack pillows under your chest.  Allow your body to relax so that your hips drop lower and make contact more completely with the floor.  Hold this position for __________ seconds.  Slowly return to lying flat on the floor. Repeat __________ times. Complete this exercise __________ times per day.  RANGE OF MOTION - Extension, Prone Press Ups  Lie on your stomach on the floor, a bed will be too soft. Place your palms about shoulder width apart and at the height of your head.  Keeping your back as relaxed as possible, slowly straighten your elbows while keeping  your hips on the floor. You may adjust the placement of your hands to maximize your comfort. As you gain motion, your hands will come more underneath your shoulders.  Hold this position __________ seconds.  Slowly return to lying flat on the floor. Repeat __________ times. Complete this exercise __________ times per day.  RANGE OF MOTION- Quadruped, Neutral Spine   Assume a hands and knees position on a firm surface. Keep your hands under your shoulders and your knees under your hips. You may place padding under your knees for comfort.  Drop your head and point your tail bone toward the ground below you. This will round out your lower back like an angry cat. Hold this position for __________ seconds.  Slowly lift your head and release your tail bone so that your back sags into a large arch, like an old horse.  Hold  this position for __________ seconds.  Repeat this until you feel limber in your lower back.  Now, find your "sweet spot." This will be the most comfortable position somewhere between the two previous positions. This is your neutral spine. Once you have found this position, tense your stomach muscles to support your lower back.  Hold this position for __________ seconds. Repeat __________ times. Complete this exercise __________ times per day.  STRENGTHENING EXERCISES - Low Back Strain These exercises may help you when beginning to rehabilitate your injury. These exercises should be done near your "sweet spot." This is the neutral, low-back arch, somewhere between fully rounded and fully arched, that is your least painful position. When performed in this safe range of motion, these exercises can be used for people who have either a flexion or extension based injury. These exercises may resolve your symptoms with or without further involvement from your physician, physical therapist or athletic trainer. While completing these exercises, remember:   Muscles can gain both the  endurance and the strength needed for everyday activities through controlled exercises.  Complete these exercises as instructed by your physician, physical therapist or athletic trainer. Increase the resistance and repetitions only as guided.  You may experience muscle soreness or fatigue, but the pain or discomfort you are trying to eliminate should never worsen during these exercises. If this pain does worsen, stop and make certain you are following the directions exactly. If the pain is still present after adjustments, discontinue the exercise until you can discuss the trouble with your caregiver. STRENGTHENING - Deep Abdominals, Pelvic Tilt  Lie on a firm bed or floor. Keeping your legs in front of you, bend your knees so they are both pointed toward the ceiling and your feet are flat on the floor.  Tense your lower abdominal muscles to press your lower back into the floor. This motion will rotate your pelvis so that your tail bone is scooping upwards rather than pointing at your feet or into the floor.  With a gentle tension and even breathing, hold this position for __________ seconds. Repeat __________ times. Complete this exercise __________ times per day.  STRENGTHENING - Abdominals, Crunches   Lie on a firm bed or floor. Keeping your legs in front of you, bend your knees so they are both pointed toward the ceiling and your feet are flat on the floor. Cross your arms over your chest.  Slightly tip your chin down without bending your neck.  Tense your abdominals and slowly lift your trunk high enough to just clear your shoulder blades. Lifting higher can put excessive stress on the lower back and does not further strengthen your abdominal muscles.  Control your return to the starting position. Repeat __________ times. Complete this exercise __________ times per day.  STRENGTHENING - Quadruped, Opposite UE/LE Lift   Assume a hands and knees position on a firm surface. Keep your hands  under your shoulders and your knees under your hips. You may place padding under your knees for comfort.  Find your neutral spine and gently tense your abdominal muscles so that you can maintain this position. Your shoulders and hips should form a rectangle that is parallel with the floor and is not twisted.  Keeping your trunk steady, lift your right hand no higher than your shoulder and then your left leg no higher than your hip. Make sure you are not holding your breath. Hold this position __________ seconds.  Continuing to keep your abdominal muscles tense and  your back steady, slowly return to your starting position. Repeat with the opposite arm and leg. Repeat __________ times. Complete this exercise __________ times per day.  STRENGTHENING - Lower Abdominals, Double Knee Lift  Lie on a firm bed or floor. Keeping your legs in front of you, bend your knees so they are both pointed toward the ceiling and your feet are flat on the floor.  Tense your abdominal muscles to brace your lower back and slowly lift both of your knees until they come over your hips. Be certain not to hold your breath.  Hold __________ seconds. Using your abdominal muscles, return to the starting position in a slow and controlled manner. Repeat __________ times. Complete this exercise __________ times per day.  POSTURE AND BODY MECHANICS CONSIDERATIONS - Low Back Strain Keeping correct posture when sitting, standing or completing your activities will reduce the stress put on different body tissues, allowing injured tissues a chance to heal and limiting painful experiences. The following are general guidelines for improved posture. Your physician or physical therapist will provide you with any instructions specific to your needs. While reading these guidelines, remember:  The exercises prescribed by your provider will help you have the flexibility and strength to maintain correct postures.  The correct posture provides  the best environment for your joints to work. All of your joints have less wear and tear when properly supported by a spine with good posture. This means you will experience a healthier, less painful body.  Correct posture must be practiced with all of your activities, especially prolonged sitting and standing. Correct posture is as important when doing repetitive low-stress activities (typing) as it is when doing a single heavy-load activity (lifting). RESTING POSITIONS Consider which positions are most painful for you when choosing a resting position. If you have pain with flexion-based activities (sitting, bending, stooping, squatting), choose a position that allows you to rest in a less flexed posture. You would want to avoid curling into a fetal position on your side. If your pain worsens with extension-based activities (prolonged standing, working overhead), avoid resting in an extended position such as sleeping on your stomach. Most people will find more comfort when they rest with their spine in a more neutral position, neither too rounded nor too arched. Lying on a non-sagging bed on your side with a pillow between your knees, or on your back with a pillow under your knees will often provide some relief. Keep in mind, being in any one position for a prolonged period of time, no matter how correct your posture, can still lead to stiffness. PROPER SITTING POSTURE In order to minimize stress and discomfort on your spine, you must sit with correct posture. Sitting with good posture should be effortless for a healthy body. Returning to good posture is a gradual process. Many people can work toward this most comfortably by using various supports until they have the flexibility and strength to maintain this posture on their own. When sitting with proper posture, your ears will fall over your shoulders and your shoulders will fall over your hips. You should use the back of the chair to support your upper  back. Your lower back will be in a neutral position, just slightly arched. You may place a small pillow or folded towel at the base of your lower back for support.  When working at a desk, create an environment that supports good, upright posture. Without extra support, muscles tire, which leads to excessive strain on joints and  other tissues. Keep these recommendations in mind: CHAIR:  A chair should be able to slide under your desk when your back makes contact with the back of the chair. This allows you to work closely.  The chair's height should allow your eyes to be level with the upper part of your monitor and your hands to be slightly lower than your elbows. BODY POSITION  Your feet should make contact with the floor. If this is not possible, use a foot rest.  Keep your ears over your shoulders. This will reduce stress on your neck and lower back. INCORRECT SITTING POSTURES  If you are feeling tired and unable to assume a healthy sitting posture, do not slouch or slump. This puts excessive strain on your back tissues, causing more damage and pain. Healthier options include:  Using more support, like a lumbar pillow.  Switching tasks to something that requires you to be upright or walking.  Talking a brief walk.  Lying down to rest in a neutral-spine position. PROLONGED STANDING WHILE SLIGHTLY LEANING FORWARD  When completing a task that requires you to lean forward while standing in one place for a long time, place either foot up on a stationary 2-4 inch high object to help maintain the best posture. When both feet are on the ground, the lower back tends to lose its slight inward curve. If this curve flattens (or becomes too large), then the back and your other joints will experience too much stress, tire more quickly, and can cause pain. CORRECT STANDING POSTURES Proper standing posture should be assumed with all daily activities, even if they only take a few moments, like when  brushing your teeth. As in sitting, your ears should fall over your shoulders and your shoulders should fall over your hips. You should keep a slight tension in your abdominal muscles to brace your spine. Your tailbone should point down to the ground, not behind your body, resulting in an over-extended swayback posture.  INCORRECT STANDING POSTURES  Common incorrect standing postures include a forward head, locked knees and/or an excessive swayback. WALKING Walk with an upright posture. Your ears, shoulders and hips should all line-up. PROLONGED ACTIVITY IN A FLEXED POSITION When completing a task that requires you to bend forward at your waist or lean over a low surface, try to find a way to stabilize 3 out of 4 of your limbs. You can place a hand or elbow on your thigh or rest a knee on the surface you are reaching across. This will provide you more stability so that your muscles do not fatigue as quickly. By keeping your knees relaxed, or slightly bent, you will also reduce stress across your lower back. CORRECT LIFTING TECHNIQUES DO :   Assume a wide stance. This will provide you more stability and the opportunity to get as close as possible to the object which you are lifting.  Tense your abdominals to brace your spine. Bend at the knees and hips. Keeping your back locked in a neutral-spine position, lift using your leg muscles. Lift with your legs, keeping your back straight.  Test the weight of unknown objects before attempting to lift them.  Try to keep your elbows locked down at your sides in order get the best strength from your shoulders when carrying an object.  Always ask for help when lifting heavy or awkward objects. INCORRECT LIFTING TECHNIQUES DO NOT:   Lock your knees when lifting, even if it is a small object.  Bend and  twist. Pivot at your feet or move your feet when needing to change directions.  Assume that you can safely pick up even a paper clip without proper  posture. Document Released: 01/08/2005 Document Revised: 04/02/2011 Document Reviewed: 04/22/2008 [facility] Patient Information 2015 [place], [place]. This information is not intended to replace advice given to you by your health care provider. Make sure you discuss any questions you have with your health care provider.

## 2013-11-19 NOTE — Progress Notes (Signed)
Subjective:    Patient ID: [name], male    DOB: 13-May-1975, 38 y.o.   MRN: [id number]  Eulas Post, MD  Chief Complaint  Patient presents with  . Back Pain    right side, lower back. x1 month. Started out as a mild pain. Now its a moderate. Described as a sharp pain when the pt. moves a certain way.    Patient Active Problem List   Diagnosis Date Noted  . Obesity (BMI 30-39.9) 10/20/2013  . Asthma, moderate persistent 08/01/2012  . Asthma with allergic rhinitis 02/06/2011  . ALLERGIC RHINITIS 09/20/2009  . GERD 09/20/2009  . HYPERTENSION 07/01/2008   Prior to Admission medications   Medication Sig Start Date End Date Taking? Authorizing Provider  albuterol (PROAIR HFA) 108 (90 BASE) MCG/ACT inhaler Inhale 2 puffs into the lungs every 4 (four) hours as needed for wheezing or shortness of breath. 10/20/13  Yes Eulas Post, MD  albuterol (PROVENTIL,VENTOLIN) 90 MCG/ACT inhaler One puff 15 minutes prior to exercise 10/12/10  Yes Dorena Cookey, MD  Fluticasone-Salmeterol (ADVAIR DISKUS) 100-50 MCG/DOSE AEPB Inhale 1 puff into the lungs 2 (two) times daily. 10/22/13  Yes Eulas Post, MD  traMADol (ULTRAM) 50 MG tablet Take 1 tablet (50 mg total) by mouth every 6 (six) hours as needed. 10/07/13  Yes Eulas Post, MD  amLODipine (NORVASC) 5 MG tablet Take 1 tablet (5 mg total) by mouth daily. 12/28/11   Eulas Post, MD  budesonide-formoterol (SYMBICORT) 160-4.5 MCG/ACT inhaler Inhale 2 puffs into the lungs 2 (two) times daily. 10/20/13   Eulas Post, MD   Medications, allergies, past medical history, surgical history, family history, social history and problem list reviewed and updated.  Back Pain    38 yom with no pertinent PMH presents with right sided low back pain for past month. 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 Two wks ago he was using a jackhammer at work and it slipped out while he was using it  and pulled on his lower back. Since that time he was had fairly constant pain right lower back described as "pulling." Ibuprofen has not helped. Pain is worst when he is moving such as getting in and out of bed or a car.   He mentions very mild discomfort left lower back but no other pain. Denies perianal loss of sensation, loss of bowel/bladder control. Denies any tingling/numbness down either lower extremity.    Review of Systems  Musculoskeletal: Positive for back pain.   No CP, SOB, fever, chills, weight loss, dysuria, N/V, diarrhea.     Objective:   Physical Exam  Constitutional: He is oriented to person, place, and time. He appears well-developed and well-nourished.  Non-toxic appearance. He does not have a sickly appearance. He does not appear ill. No distress.  BP 124/80  Pulse 107  Temp(Src) 98.8 F (37.1 C) (Oral)  Resp 18  Ht 6\' 3"  (1.905 m)  Wt 271 lb (122.925 kg)  BMI 33.87 kg/m2  SpO2 98%   Cardiovascular: Normal rate and regular rhythm.   Pulses:      Dorsalis pedis pulses are 2+ on the right side, and 2+ on the left side.       Posterior tibial pulses are 2+ on the right side, and 2+ on the left side.  Musculoskeletal:       Lumbar back: He exhibits tenderness, pain and spasm. He exhibits normal range of  motion, no bony tenderness, no deformity and no laceration.  TTP over right lumbar paraspinal muscles. Spasm noted right lumbar paraspinal muscles. Neg SLR bilaterally.   Neurological: He is alert and oriented to person, place, and time. He has normal strength. No sensory deficit.  Skin: No bruising, no ecchymosis and no rash noted.  Psychiatric: He has a normal mood and affect. His speech is normal.      Assessment & Plan:   38 yom with no pertinent PMH presents with right sided low back pain for past month.  Muscle spasm of back - Plan: cyclobenzaprine (FLEXERIL) 10 MG tablet --No concern for herniated disc at this time, pain most likely due to lumbar  spasm --flexeril --Heat/ice, nsaids for pain/inflammation --Stretches given, encourage twice daily until feeling eased up, then encourage light strength exercises for low back --Note given no work for 3 days  Julieta Gutting, PA-C Physician Assistant-Certified [facility]  11/19/2013 3:02 PM

## 2013-11-20 ENCOUNTER — Other Ambulatory Visit: Payer: Self-pay | Admitting: Family Medicine

## 2013-11-23 NOTE — Telephone Encounter (Signed)
Last visit 10/20/13 Last refill 10/07/13 #120 0 refill

## 2013-11-23 NOTE — Telephone Encounter (Signed)
Refill OK.  Try to taper back if possible.

## 2013-12-29 ENCOUNTER — Other Ambulatory Visit: Payer: Self-pay | Admitting: Family Medicine

## 2013-12-29 NOTE — Telephone Encounter (Signed)
Refill OK

## 2013-12-29 NOTE — Telephone Encounter (Signed)
Last visit 10/20/13 Last refill 11/24/13 #120 0 refill

## 2014-02-04 ENCOUNTER — Other Ambulatory Visit: Payer: Self-pay | Admitting: Family Medicine

## 2014-02-08 NOTE — Telephone Encounter (Signed)
Refill OK with one additional refill. 

## 2014-02-08 NOTE — Telephone Encounter (Signed)
Last visit 10/20/13 Last refill 12/30/13 #120 0 refill

## 2014-03-15 ENCOUNTER — Other Ambulatory Visit: Payer: Self-pay | Admitting: Family Medicine

## 2014-04-12 ENCOUNTER — Other Ambulatory Visit: Payer: Self-pay | Admitting: Family Medicine

## 2014-04-12 NOTE — Telephone Encounter (Signed)
Last visit 10/20/13 Last refill 02/08/14 #120 1 refill

## 2014-04-12 NOTE — Telephone Encounter (Signed)
Refill OK

## 2014-05-19 ENCOUNTER — Other Ambulatory Visit: Payer: Self-pay | Admitting: Family Medicine

## 2014-05-19 NOTE — Telephone Encounter (Signed)
Last visit 10/20/13 Last refill 04/12/14 #120 0 refill

## 2014-05-19 NOTE — Telephone Encounter (Signed)
Refill OK

## 2014-05-31 ENCOUNTER — Encounter: Payer: Self-pay | Admitting: Family Medicine

## 2014-05-31 ENCOUNTER — Ambulatory Visit (INDEPENDENT_AMBULATORY_CARE_PROVIDER_SITE_OTHER): Payer: 59 | Admitting: Family Medicine

## 2014-05-31 VITALS — BP 130/80 | HR 100 | Temp 98.3°F | Wt 257.0 lb

## 2014-05-31 DIAGNOSIS — K921 Melena: Secondary | ICD-10-CM | POA: Diagnosis not present

## 2014-05-31 LAB — CBC WITH DIFFERENTIAL/PLATELET
BASOS ABS: 0 10*3/uL (ref 0.0–0.1)
BASOS PCT: 0.7 % (ref 0.0–3.0)
Eosinophils Absolute: 0.4 10*3/uL (ref 0.0–0.7)
Eosinophils Relative: 6.7 % — ABNORMAL HIGH (ref 0.0–5.0)
HCT: 43.8 % (ref 39.0–52.0)
HEMOGLOBIN: 15.2 g/dL (ref 13.0–17.0)
LYMPHS ABS: 1.7 10*3/uL (ref 0.7–4.0)
LYMPHS PCT: 27.6 % (ref 12.0–46.0)
MCHC: 34.6 g/dL (ref 30.0–36.0)
MCV: 88.4 fl (ref 78.0–100.0)
Monocytes Absolute: 0.5 10*3/uL (ref 0.1–1.0)
Monocytes Relative: 8.8 % (ref 3.0–12.0)
Neutro Abs: 3.5 10*3/uL (ref 1.4–7.7)
Neutrophils Relative %: 56.2 % (ref 43.0–77.0)
Platelets: 334 10*3/uL (ref 150.0–400.0)
RBC: 4.96 Mil/uL (ref 4.22–5.81)
RDW: 13 % (ref 11.5–15.5)
WBC: 6.2 10*3/uL (ref 4.0–10.5)

## 2014-05-31 MED ORDER — OMEPRAZOLE 40 MG PO CPDR: 40.0000 mg | DELAYED_RELEASE_CAPSULE | Freq: Every day | ORAL | Status: DC

## 2014-05-31 NOTE — Patient Instructions (Signed)
Bloody Stools  Bloody stools often mean that there is a problem in the digestive tract. Your caregiver may use the term "melena" to describe black, tarry, and bad smelling stools or "hematochezia" to describe red or maroon-colored stools. Blood seen in the stool can be caused by bleeding anywhere along the intestinal tract.   A black stool usually means that blood is coming from the upper part of the gastrointestinal tract (esophagus, stomach, or small bowel). Passing maroon-colored stools or bright red blood usually means that blood is coming from lower down in the large bowel or the rectum. However, sometimes massive bleeding in the stomach or small intestine can cause bright red bloody stools.   Consuming black licorice, lead, iron pills, medicines containing bismuth subsalicylate, or blueberries can also cause black stools. Your caregiver can test black stools to see if blood is present.  It is important that the cause of the bleeding be found. Treatment can then be started, and the problem can be corrected. Rectal bleeding may not be serious, but you should not assume everything is okay until you know the cause. It is very important to follow up with your caregiver or a specialist in gastrointestinal problems.  CAUSES   Blood in the stools can come from various underlying causes. Often, the cause is not found during your first visit. Testing is often needed to discover the cause of bleeding in the gastrointestinal tract. Causes range from simple to serious or even life-threatening. Possible causes include:  · Hemorrhoids. These are veins that are full of blood (engorged) in the rectum. They cause pain, inflammation, and may bleed.  · Anal fissures. These are areas of painful tearing which may bleed. They are often caused by passing hard stool.  · Diverticulosis. These are pouches that form on the colon over time, with age, and may bleed significantly.  · Diverticulitis. This is inflammation in areas with  diverticulosis. It can cause pain, fever, and bloody stools, although bleeding is rare.  · Proctitis and colitis. These are inflamed areas of the rectum or colon. They may cause pain, fever, and bloody stools.  · Polyps and cancer. Colon cancer is a leading cause of preventable cancer death. It often starts out as precancerous polyps that can be removed during a colonoscopy, preventing progression into cancer. Sometimes, polyps and cancer may cause rectal bleeding.  · Gastritis and ulcers. Bleeding from the upper gastrointestinal tract (near the stomach) may travel through the intestines and produce black, sometimes tarry, often bad smelling stools. In certain cases, if the bleeding is fast enough, the stools may not be black, but red and the condition may be life-threatening.  SYMPTOMS   You may have stools that are bright red and bloody, that are normal color with blood on them, or that are dark black and tarry. In some cases, you may only have blood in the toilet bowl. Any of these cases need medical care. You may also have:  · Pain at the anus or anywhere in the rectum.  · Lightheadedness or feeling faint.  · Extreme weakness.  · Nausea or vomiting.  · Fever.  DIAGNOSIS  Your caregiver may use the following methods to find the cause of your bleeding:  · Taking a medical history. Age is important. Older people tend to develop polyps and cancer more often. If there is anal pain and a hard, large stool associated with bleeding, a tear of the anus may be the cause. If blood drips into the toilet after a bowel movement, bleeding hemorrhoids may be the   problem. The color and frequency of the bleeding are additional considerations. In most cases, the medical history provides clues, but seldom the final answer.  · A visual and finger (digital) exam. Your caregiver will inspect the anal area, looking for tears and hemorrhoids. A finger exam can provide information when there is tenderness or a growth inside. In men, the  prostate is also examined.  · Endoscopy. Several types of small, long scopes (endoscopes) are used to view the colon.  ¨ In the office, your caregiver may use a rigid, or more commonly, a flexible viewing sigmoidoscope. This exam is called flexible sigmoidoscopy. It is performed in 5 to 10 minutes.  ¨ A more thorough exam is accomplished with a colonoscope. It allows your caregiver to view the entire 5 to 6 foot long colon. Medicine to help you relax (sedative) is usually given for this exam. Frequently, a bleeding lesion may be present beyond the reach of the sigmoidoscope. So, a colonoscopy may be the best exam to start with. Both exams are usually done on an outpatient basis. This means the patient does not stay overnight in the hospital or surgery center.  ¨ An upper endoscopy may be needed to examine your stomach. Sedation is used and a flexible endoscope is put in your mouth, down to your stomach.  · A barium enema X-ray. This is an X-ray exam. It uses liquid barium inserted by enema into the rectum. This test alone may not identify an actual bleeding point. X-rays highlight abnormal shadows, such as those made by lumps (tumors), diverticuli, or colitis.  TREATMENT   Treatment depends on the cause of your bleeding.   · For bleeding from the stomach or colon, the caregiver doing your endoscopy or colonoscopy may be able to stop the bleeding as part of the procedure.  · Inflammation or infection of the colon can be treated with medicines.  · Many rectal problems can be treated with creams, suppositories, or warm baths.  · Surgery is sometimes needed.  · Blood transfusions are sometimes needed if you have lost a lot of blood.  · For any bleeding problem, let your caregiver know if you take aspirin or other blood thinners regularly.  HOME CARE INSTRUCTIONS   · Take any medicines exactly as prescribed.  · Keep your stools soft by eating a diet high in fiber. Prunes (1 to 3 a day) work well for many people.  · Drink  enough water and fluids to keep your urine clear or pale yellow.  · Take sitz baths if advised. A sitz bath is when you sit in a bathtub with warm water for 10 to 15 minutes to soak, soothe, and cleanse the rectal area.  · If enemas or suppositories are advised, be sure you know how to use them. Tell your caregiver if you have problems with this.  · Monitor your bowel movements to look for signs of improvement or worsening.  SEEK MEDICAL CARE IF:   · You do not improve in the time expected.  · Your condition worsens after initial improvement.  · You develop any new symptoms.  SEEK IMMEDIATE MEDICAL CARE IF:   · You develop severe or prolonged rectal bleeding.  · You vomit blood.  · You feel weak or faint.  · You have a fever.  MAKE SURE YOU:  · Understand these instructions.  · Will watch your condition.  · Will get help right away if you are not doing well or get worse.    Document Released: 12/29/2001 Document Revised: 04/02/2011 Document Reviewed: 05/26/2010  Exit[employer]® Patient Information ©2015 [employer]. This information is not intended to replace advice given to you by your health care provider. Make sure you discuss any questions you have with your health care provider.

## 2014-05-31 NOTE — Progress Notes (Signed)
Pre visit review using our clinic review tool, if applicable. No additional management support is needed unless otherwise documented below in the visit note. 
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
## 2014-07-03 ENCOUNTER — Other Ambulatory Visit: Payer: Self-pay | Admitting: Family Medicine

## 2014-07-06 NOTE — Telephone Encounter (Signed)
Refill OK

## 2014-08-24 ENCOUNTER — Other Ambulatory Visit (INDEPENDENT_AMBULATORY_CARE_PROVIDER_SITE_OTHER): Payer: 59

## 2014-08-24 ENCOUNTER — Encounter: Payer: Self-pay | Admitting: Family Medicine

## 2014-08-24 DIAGNOSIS — Z0000 Encounter for general adult medical examination without abnormal findings: Secondary | ICD-10-CM

## 2014-08-24 LAB — CBC WITH DIFFERENTIAL/PLATELET
BASOS ABS: 0 10*3/uL (ref 0.0–0.1)
BASOS PCT: 0.7 % (ref 0.0–3.0)
Eosinophils Absolute: 0.4 10*3/uL (ref 0.0–0.7)
Eosinophils Relative: 7.1 % — ABNORMAL HIGH (ref 0.0–5.0)
HCT: 41.1 % (ref 39.0–52.0)
HEMOGLOBIN: 13.8 g/dL (ref 13.0–17.0)
LYMPHS ABS: 1.6 10*3/uL (ref 0.7–4.0)
Lymphocytes Relative: 31.5 % (ref 12.0–46.0)
MCHC: 33.7 g/dL (ref 30.0–36.0)
MCV: 91.9 fl (ref 78.0–100.0)
Monocytes Absolute: 0.4 10*3/uL (ref 0.1–1.0)
Monocytes Relative: 7.1 % (ref 3.0–12.0)
Neutro Abs: 2.8 10*3/uL (ref 1.4–7.7)
Neutrophils Relative %: 53.6 % (ref 43.0–77.0)
Platelets: 335 10*3/uL (ref 150.0–400.0)
RBC: 4.47 Mil/uL (ref 4.22–5.81)
RDW: 13.3 % (ref 11.5–15.5)
WBC: 5.2 10*3/uL (ref 4.0–10.5)

## 2014-08-24 LAB — HEPATIC FUNCTION PANEL
ALBUMIN: 4.4 g/dL (ref 3.5–5.2)
ALT: 31 U/L (ref 0–53)
AST: 22 U/L (ref 0–37)
Alkaline Phosphatase: 62 U/L (ref 39–117)
BILIRUBIN DIRECT: 0.1 mg/dL (ref 0.0–0.3)
Total Bilirubin: 0.5 mg/dL (ref 0.2–1.2)
Total Protein: 6.7 g/dL (ref 6.0–8.3)

## 2014-08-24 LAB — LIPID PANEL
Cholesterol: 167 mg/dL (ref 0–200)
HDL: 43.7 mg/dL (ref >39.00)
LDL Cholesterol: 106 mg/dL — ABNORMAL HIGH (ref 0–99)
NonHDL: 123.1
Total CHOL/HDL Ratio: 4
Triglycerides: 86 mg/dL (ref 0.0–149.0)
VLDL: 17.2 mg/dL (ref 0.0–40.0)

## 2014-08-24 LAB — BASIC METABOLIC PANEL
BUN: 17 mg/dL (ref 6–23)
CALCIUM: 9.4 mg/dL (ref 8.4–10.5)
CO2: 27 mEq/L (ref 19–32)
Chloride: 106 mEq/L (ref 96–112)
Creatinine, Ser: 1.12 mg/dL (ref 0.40–1.50)
GFR: 92.93 mL/min (ref >60.00)
Glucose, Bld: 94 mg/dL (ref 70–99)
Potassium: 4.5 mEq/L (ref 3.5–5.1)
Sodium: 139 mEq/L (ref 135–145)

## 2014-08-24 LAB — PSA: PSA: 0.7 ng/mL (ref 0.10–4.00)

## 2014-08-24 LAB — TSH: TSH: 0.49 u[IU]/mL (ref 0.35–4.50)

## 2014-08-30 ENCOUNTER — Encounter: Payer: Self-pay | Admitting: Family Medicine

## 2014-08-30 ENCOUNTER — Ambulatory Visit (INDEPENDENT_AMBULATORY_CARE_PROVIDER_SITE_OTHER): Payer: 59 | Admitting: Family Medicine

## 2014-08-30 VITALS — BP 130/80 | HR 94 | Temp 98.1°F | Ht 75.0 in | Wt 259.0 lb

## 2014-08-30 DIAGNOSIS — Z0000 Encounter for general adult medical examination without abnormal findings: Secondary | ICD-10-CM | POA: Diagnosis not present

## 2014-08-30 MED ORDER — LORATADINE-PSEUDOEPHEDRINE ER 10-240 MG PO TB24: 1.0000 | ORAL_TABLET | Freq: Every day | ORAL | Status: DC

## 2014-08-30 MED ORDER — TRAMADOL HCL 50 MG PO TABS: 50.0000 mg | ORAL_TABLET | Freq: Four times a day (QID) | ORAL | Status: DC | PRN

## 2014-08-30 NOTE — Progress Notes (Signed)
Pre visit review using our clinic review tool, if applicable. No additional management support is needed unless otherwise documented below in the visit note. 
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
## 2014-09-07 ENCOUNTER — Encounter: Payer: Self-pay | Admitting: Family Medicine

## 2014-12-27 ENCOUNTER — Other Ambulatory Visit: Payer: Self-pay | Admitting: Family Medicine

## 2014-12-29 NOTE — Telephone Encounter (Signed)
Tramadol (Ultram) 50 mg tablet -- okay for refill?  Last seen: 08/30/14 Last refill: 08/30/14

## 2014-12-30 NOTE — Telephone Encounter (Signed)
Refill OK

## 2014-12-30 NOTE — Telephone Encounter (Signed)
Rx called in 

## 2015-01-28 ENCOUNTER — Ambulatory Visit (INDEPENDENT_AMBULATORY_CARE_PROVIDER_SITE_OTHER): Payer: Commercial Managed Care - HMO | Admitting: Family Medicine

## 2015-01-28 ENCOUNTER — Encounter: Payer: Self-pay | Admitting: Family Medicine

## 2015-01-28 VITALS — BP 140/90 | HR 94 | Temp 98.7°F | Wt 267.0 lb

## 2015-01-28 DIAGNOSIS — M549 Dorsalgia, unspecified: Secondary | ICD-10-CM

## 2015-01-28 DIAGNOSIS — Z23 Encounter for immunization: Secondary | ICD-10-CM | POA: Diagnosis not present

## 2015-01-28 MED ORDER — CYCLOBENZAPRINE HCL 10 MG PO TABS: 10.0000 mg | ORAL_TABLET | Freq: Three times a day (TID) | ORAL | Status: DC | PRN

## 2015-01-28 MED ORDER — PREDNISONE 20 MG PO TABS: ORAL_TABLET | ORAL | Status: DC

## 2015-01-28 NOTE — Progress Notes (Signed)
Pre visit review using our clinic review tool, if applicable. No additional management support is needed unless otherwise documented below in the visit note. 
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
## 2015-01-28 NOTE — Patient Instructions (Signed)
Follow up for any upper extremity weakness or progressive numbness- or if pain not improved after 1-2 weeks.

## 2015-02-03 ENCOUNTER — Other Ambulatory Visit: Payer: Self-pay | Admitting: Family Medicine

## 2015-02-14 ENCOUNTER — Telehealth: Payer: Self-pay | Admitting: Family Medicine

## 2015-02-14 DIAGNOSIS — G542 Cervical root disorders, not elsewhere classified: Secondary | ICD-10-CM

## 2015-02-14 NOTE — Telephone Encounter (Signed)
Pt said he is still having that pain in his back by the left shoulder blade. He came in a few weeks ago to see Dr Elease Hashimoto sbout this pain. He is asking if he need to come back in to see the doctor or if he would like to refer him to see a orthopedic doctor. He is having a little numbness in his left arm.

## 2015-02-14 NOTE — Telephone Encounter (Signed)
Referral entered  

## 2015-02-14 NOTE — Telephone Encounter (Signed)
Pt is aware.  

## 2015-02-14 NOTE — Addendum Note (Signed)
Addended by: Elio Forget on: 02/14/2015 01:26 PM   Modules accepted: Orders

## 2015-02-14 NOTE — Telephone Encounter (Signed)
May have some cervical nerve impingement. I would suggest follow up with [facility] for further evaluation.

## 2015-02-16 ENCOUNTER — Telehealth: Payer: Self-pay | Admitting: Family Medicine

## 2015-02-16 NOTE — Telephone Encounter (Signed)
Please advise 

## 2015-02-16 NOTE — Telephone Encounter (Signed)
We can send in note stating he has been referred to ortho specialist.

## 2015-02-16 NOTE — Telephone Encounter (Signed)
Patient said he needs a note for his employer saying he called in to Dr. Elease Hashimoto on Mon. 02/14/15 asking if he needed to come in or if he needed a referral, and Dr. Elease Hashimoto referred him to a Ortho for a next week appt.Marland Kitchen

## 2015-02-17 ENCOUNTER — Encounter: Payer: Self-pay | Admitting: *Deleted

## 2015-02-17 NOTE — Telephone Encounter (Signed)
Letter ready for pick up and patient is aware. 

## 2015-02-22 ENCOUNTER — Encounter: Payer: Self-pay | Admitting: Family Medicine

## 2015-02-22 ENCOUNTER — Ambulatory Visit (INDEPENDENT_AMBULATORY_CARE_PROVIDER_SITE_OTHER)
Admission: RE | Admit: 2015-02-22 | Discharge: 2015-02-22 | Disposition: A | Discharge status: Home / Self Care | Payer: Commercial Managed Care - HMO | Source: Ambulatory Visit | Attending: Family Medicine | Admitting: Family Medicine

## 2015-02-22 ENCOUNTER — Ambulatory Visit (INDEPENDENT_AMBULATORY_CARE_PROVIDER_SITE_OTHER): Payer: Commercial Managed Care - HMO | Admitting: Family Medicine

## 2015-02-22 VITALS — BP 132/86 | HR 118 | Ht 76.0 in | Wt 269.0 lb

## 2015-02-22 DIAGNOSIS — M5412 Radiculopathy, cervical region: Secondary | ICD-10-CM | POA: Insufficient documentation

## 2015-02-22 DIAGNOSIS — M542 Cervicalgia: Secondary | ICD-10-CM

## 2015-02-22 MED ORDER — GABAPENTIN 100 MG PO CAPS: 200.0000 mg | ORAL_CAPSULE | Freq: Every day | ORAL | Status: DC

## 2015-02-22 MED ORDER — MELOXICAM 15 MG PO TABS: 15.0000 mg | ORAL_TABLET | Freq: Every day | ORAL | Status: DC

## 2015-02-22 NOTE — Progress Notes (Signed)
Pre visit review using our clinic review tool, if applicable. No additional management support is needed unless otherwise documented below in the visit note. 

## 2015-02-22 NOTE — Patient Instructions (Addendum)
Good to see you.  Ice 20 minutes 2 times daily. Usually after activity and before bed. Meloxicam daily for 10 days then as needed.  Stop if it hurts stomach  Gabapentin 100mg  at night for 3 nights, then 200mg  at night for 3 nights then 300mg  nightly thereafter.  Avoid heavy lifting and given you a note.  Xrays downstairs today  MRI will be scheduled and they will call you  After you know when you are having the MRI please make an appointment with me in 1-2 days after and we will go over it together
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
## 2015-02-22 NOTE — Assessment & Plan Note (Signed)
Seems to be in the C6 and C7 distribution. X-rays ordered today. Patient does have some mild weakness on the side and a positive Spurling's. Deep tendon reflexes aren't decreased. I'm concerned than patient does have a C6 or C7 nerve root impingement. Likely herniated disc. Patient's x-rays will likely and correspond with this. Depending on this I do feel advance imaging is warranted. Patient given meloxicam as well as gabapentin. Warned of potential side effects. Hopefully that this will help some of the pain. Has tramadol for breakthrough pain. We'll be following up with me after MRI to discuss further evaluation and treatment.

## 2015-02-27 ENCOUNTER — Other Ambulatory Visit: Payer: Self-pay | Admitting: Family Medicine

## 2015-03-01 ENCOUNTER — Encounter: Payer: Self-pay | Admitting: Family Medicine

## 2015-03-02 ENCOUNTER — Encounter: Payer: Self-pay | Admitting: Family Medicine

## 2015-03-02 MED ORDER — TIZANIDINE HCL 4 MG PO TABS: 4.0000 mg | ORAL_TABLET | Freq: Three times a day (TID) | ORAL | Status: DC | PRN

## 2015-03-02 MED ORDER — PREDNISONE 50 MG PO TABS: 50.0000 mg | ORAL_TABLET | Freq: Every day | ORAL | Status: DC

## 2015-03-02 NOTE — Telephone Encounter (Signed)
Last seen 08-30-2014, No pending appt Last refill 08-29-2012 #120, 3rf

## 2015-03-03 ENCOUNTER — Encounter: Payer: Self-pay | Admitting: Family Medicine

## 2015-03-03 NOTE — Telephone Encounter (Signed)
Refill OK

## 2015-03-03 NOTE — Telephone Encounter (Signed)
Pt refills ran out in jan 2017. Pt needs med today please

## 2015-03-04 ENCOUNTER — Inpatient Hospital Stay: Admission: RE | Admit: 2015-03-04 | Payer: Commercial Managed Care - HMO | Source: Ambulatory Visit

## 2015-03-05 ENCOUNTER — Ambulatory Visit
Admission: RE | Admit: 2015-03-05 | Discharge: 2015-03-05 | Disposition: A | Discharge status: Home / Self Care | Payer: Commercial Managed Care - HMO | Source: Ambulatory Visit | Attending: Family Medicine | Admitting: Family Medicine

## 2015-03-05 DIAGNOSIS — M542 Cervicalgia: Secondary | ICD-10-CM

## 2015-03-08 ENCOUNTER — Encounter: Payer: Self-pay | Admitting: Family Medicine

## 2015-03-08 DIAGNOSIS — M5412 Radiculopathy, cervical region: Secondary | ICD-10-CM

## 2015-03-16 ENCOUNTER — Ambulatory Visit
Admission: RE | Admit: 2015-03-16 | Discharge: 2015-03-16 | Disposition: A | Discharge status: Home / Self Care | Payer: Commercial Managed Care - HMO | Source: Ambulatory Visit | Attending: Family Medicine | Admitting: Family Medicine

## 2015-03-16 DIAGNOSIS — M5412 Radiculopathy, cervical region: Secondary | ICD-10-CM

## 2015-03-16 MED ORDER — TRIAMCINOLONE ACETONIDE 40 MG/ML IJ SUSP (RADIOLOGY): 60.0000 mg | Freq: Once | INTRAMUSCULAR | Status: DC

## 2015-03-16 MED ORDER — IOHEXOL 300 MG/ML  SOLN
1.0000 mL | Freq: Once | INTRAMUSCULAR | Status: DC | PRN
Start: 2015-03-16 — End: 2015-03-17

## 2015-03-16 NOTE — Discharge Instructions (Signed)

## 2015-03-18 ENCOUNTER — Other Ambulatory Visit: Payer: Commercial Managed Care - HMO

## 2015-04-07 ENCOUNTER — Other Ambulatory Visit: Payer: Self-pay | Admitting: Family Medicine

## 2015-04-08 ENCOUNTER — Other Ambulatory Visit: Payer: Self-pay | Admitting: Family Medicine

## 2015-04-12 ENCOUNTER — Encounter: Payer: Self-pay | Admitting: Family Medicine

## 2015-04-12 ENCOUNTER — Ambulatory Visit (INDEPENDENT_AMBULATORY_CARE_PROVIDER_SITE_OTHER)
Admission: RE | Admit: 2015-04-12 | Discharge: 2015-04-12 | Disposition: A | Discharge status: Home / Self Care | Payer: Commercial Managed Care - HMO | Source: Ambulatory Visit | Attending: Family Medicine | Admitting: Family Medicine

## 2015-04-12 ENCOUNTER — Other Ambulatory Visit (INDEPENDENT_AMBULATORY_CARE_PROVIDER_SITE_OTHER): Payer: Commercial Managed Care - HMO

## 2015-04-12 ENCOUNTER — Ambulatory Visit (INDEPENDENT_AMBULATORY_CARE_PROVIDER_SITE_OTHER): Payer: Commercial Managed Care - HMO | Admitting: Family Medicine

## 2015-04-12 VITALS — BP 136/84 | HR 111 | Ht 76.0 in | Wt 270.0 lb

## 2015-04-12 DIAGNOSIS — M25562 Pain in left knee: Secondary | ICD-10-CM

## 2015-04-12 DIAGNOSIS — M25561 Pain in right knee: Secondary | ICD-10-CM

## 2015-04-12 DIAGNOSIS — M13169 Monoarthritis, not elsewhere classified, unspecified knee: Secondary | ICD-10-CM

## 2015-04-12 DIAGNOSIS — M1710 Unilateral primary osteoarthritis, unspecified knee: Secondary | ICD-10-CM | POA: Insufficient documentation

## 2015-04-12 MED ORDER — DICLOFENAC SODIUM 2 % TD SOLN: 2.0000 "application " | Freq: Two times a day (BID) | TRANSDERMAL | Status: DC

## 2015-04-12 MED ORDER — VITAMIN D (ERGOCALCIFEROL) 1.25 MG (50000 UNIT) PO CAPS: 50000.0000 [IU] | ORAL_CAPSULE | ORAL | Status: DC
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
## 2015-04-12 NOTE — Patient Instructions (Addendum)
Good to see you.  Ice 20 minutes 2 times daily. Usually after activity and before bed. Exercises 3 times a week.  pennsaid pinkie amount topically 2 times daily as needed.  Good shoes with rigid bottom.  [name], [place] or New balance greater then 700 More like bike and elliptical for cardio Get xray downstairs on your way out See me again in 3 weeks and if not perfect we will try injection.

## 2015-04-12 NOTE — Assessment & Plan Note (Signed)
Believe the patient does have some mild patellofemoral arthritis. X-rays are pending. We discussed home exercises. If any type of pain we'll consider injection. Patient did work with Product/process development scientist. Patient given a prescription of topical anti-inflammatories I think be beneficial as well as an icing protocol. Patient coming back in 4 weeks.

## 2015-04-12 NOTE — Progress Notes (Signed)
Pre visit review using our clinic review tool, if applicable. No additional management support is needed unless otherwise documented below in the visit note. 

## 2015-05-03 ENCOUNTER — Ambulatory Visit: Payer: Commercial Managed Care - HMO | Admitting: Family Medicine

## 2015-05-03 DIAGNOSIS — Z0289 Encounter for other administrative examinations: Secondary | ICD-10-CM

## 2015-05-09 ENCOUNTER — Other Ambulatory Visit: Payer: Self-pay | Admitting: Family Medicine

## 2015-05-10 NOTE — Telephone Encounter (Signed)
Last seen on 01-28-2015 acute No pending appt Last filled 04/08/2015 #120, 0rf

## 2015-05-11 MED ORDER — TRAMADOL HCL 50 MG PO TABS: 50.0000 mg | ORAL_TABLET | Freq: Four times a day (QID) | ORAL | Status: DC | PRN

## 2015-05-11 NOTE — Telephone Encounter (Signed)
Refill OK

## 2015-06-10 ENCOUNTER — Other Ambulatory Visit: Payer: Self-pay | Admitting: Family Medicine

## 2015-06-10 NOTE — Telephone Encounter (Signed)
Last refill was 05/11/15 #120 Last OV was 01-28-2015 Please advise

## 2015-06-12 NOTE — Telephone Encounter (Signed)
Refill OK.  He stated last visit he is awaiting surgery for his left shoulder.  Any updates regarding when?

## 2015-06-25 ENCOUNTER — Other Ambulatory Visit: Payer: Self-pay | Admitting: Family Medicine

## 2015-07-07 ENCOUNTER — Ambulatory Visit (INDEPENDENT_AMBULATORY_CARE_PROVIDER_SITE_OTHER): Payer: Commercial Managed Care - HMO | Admitting: Family Medicine

## 2015-07-07 VITALS — BP 152/82 | HR 64 | Wt 267.0 lb

## 2015-07-07 DIAGNOSIS — M7661 Achilles tendinitis, right leg: Secondary | ICD-10-CM | POA: Diagnosis not present

## 2015-07-07 DIAGNOSIS — M13169 Monoarthritis, not elsewhere classified, unspecified knee: Secondary | ICD-10-CM

## 2015-07-07 DIAGNOSIS — M7662 Achilles tendinitis, left leg: Secondary | ICD-10-CM | POA: Diagnosis not present

## 2015-07-07 MED ORDER — TRAMADOL HCL 50 MG PO TABS: 50.0000 mg | ORAL_TABLET | Freq: Four times a day (QID) | ORAL | Status: DC | PRN

## 2015-07-07 MED ORDER — PREDNISONE 50 MG PO TABS: 50.0000 mg | ORAL_TABLET | Freq: Every day | ORAL | Status: DC
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
## 2015-07-07 NOTE — Assessment & Plan Note (Addendum)
Patient was given an injection in the right knee. We discussed icing regimen. Patient will be sent to formal physical therapy for further evaluation and treatment. Am hoping that these will be beneficial. Patient has a brace that I think will help with some stability. Worsening symptoms she could be a candidate for viscous supplementation or we can consider advanced imaging .

## 2015-07-07 NOTE — Assessment & Plan Note (Signed)
Bilateral Achilles tendinitis. Patient will be going to formal physical therapy. We discussed icing regimen. Discussed heel lifts. Follow-up in 4-6 weeks. Patient has had injections before and does not want to have them again. Oral prednisone prescribed as well.

## 2015-07-07 NOTE — Patient Instructions (Addendum)
Good to see you  I am sorry you are hurting Injected the knee again.  Ice 20 minutes 2 times daily. Usually after activity and before bed. pennsaid pinkie amount topically 2 times daily as needed.  And try it on your heels [email or web address] heel lift you can stick down in your shoe so it will not move.  prednisone daily for 5 days to help with the pain  Physical therapy will be calling yo u See me again in 4 weeks and if not better we can try different injections

## 2015-07-09 ENCOUNTER — Other Ambulatory Visit: Payer: Self-pay | Admitting: Family Medicine

## 2015-08-08 ENCOUNTER — Other Ambulatory Visit: Payer: Self-pay | Admitting: Family Medicine

## 2015-08-08 NOTE — Telephone Encounter (Signed)
Refill OK

## 2015-08-08 NOTE — Telephone Encounter (Signed)
Last OV 01/28/2015  Last refill 07/07/2015 #120 No pending appt scheduled Please advise

## 2015-09-07 ENCOUNTER — Other Ambulatory Visit: Payer: Self-pay | Admitting: Family Medicine

## 2015-09-09 NOTE — Telephone Encounter (Signed)
Refill with 2 additional refills. 

## 2015-10-01 ENCOUNTER — Other Ambulatory Visit: Payer: Self-pay | Admitting: Family Medicine

## 2015-12-02 ENCOUNTER — Ambulatory Visit (INDEPENDENT_AMBULATORY_CARE_PROVIDER_SITE_OTHER): Payer: Commercial Managed Care - HMO | Admitting: Family Medicine

## 2015-12-02 ENCOUNTER — Encounter: Payer: Self-pay | Admitting: Family Medicine

## 2015-12-02 VITALS — BP 122/82 | HR 86 | Temp 98.6°F | Ht 76.0 in | Wt 262.0 lb

## 2015-12-02 DIAGNOSIS — Z23 Encounter for immunization: Secondary | ICD-10-CM | POA: Diagnosis not present

## 2015-12-02 DIAGNOSIS — Z0000 Encounter for general adult medical examination without abnormal findings: Secondary | ICD-10-CM

## 2015-12-02 LAB — CBC WITH DIFFERENTIAL/PLATELET
BASOS PCT: 0.7 % (ref 0.0–3.0)
Basophils Absolute: 0 10*3/uL (ref 0.0–0.1)
EOS PCT: 4.4 % (ref 0.0–5.0)
Eosinophils Absolute: 0.3 10*3/uL (ref 0.0–0.7)
HCT: 42.4 % (ref 39.0–52.0)
HEMOGLOBIN: 14.5 g/dL (ref 13.0–17.0)
LYMPHS ABS: 1.7 10*3/uL (ref 0.7–4.0)
Lymphocytes Relative: 28.8 % (ref 12.0–46.0)
MCHC: 34.2 g/dL (ref 30.0–36.0)
MCV: 90.8 fl (ref 78.0–100.0)
MONO ABS: 0.5 10*3/uL (ref 0.1–1.0)
Monocytes Relative: 8.9 % (ref 3.0–12.0)
Neutro Abs: 3.4 10*3/uL (ref 1.4–7.7)
Neutrophils Relative %: 57.2 % (ref 43.0–77.0)
Platelets: 335 10*3/uL (ref 150.0–400.0)
RBC: 4.67 Mil/uL (ref 4.22–5.81)
RDW: 13.1 % (ref 11.5–15.5)
WBC: 6 10*3/uL (ref 4.0–10.5)

## 2015-12-02 LAB — BASIC METABOLIC PANEL
BUN: 16 mg/dL (ref 6–23)
CO2: 26 mEq/L (ref 19–32)
Calcium: 9.7 mg/dL (ref 8.4–10.5)
Chloride: 104 mEq/L (ref 96–112)
Creatinine, Ser: 1.17 mg/dL (ref 0.40–1.50)
GFR: 87.82 mL/min (ref >60.00)
Glucose, Bld: 82 mg/dL (ref 70–99)
POTASSIUM: 4.2 meq/L (ref 3.5–5.1)
SODIUM: 140 meq/L (ref 135–145)

## 2015-12-02 LAB — HEPATIC FUNCTION PANEL
ALBUMIN: 4.6 g/dL (ref 3.5–5.2)
ALK PHOS: 75 U/L (ref 39–117)
ALT: 24 U/L (ref 0–53)
AST: 18 U/L (ref 0–37)
BILIRUBIN DIRECT: 0.1 mg/dL (ref 0.0–0.3)
TOTAL PROTEIN: 7.1 g/dL (ref 6.0–8.3)
Total Bilirubin: 0.5 mg/dL (ref 0.2–1.2)

## 2015-12-02 LAB — PSA: PSA: 0.56 ng/mL (ref 0.10–4.00)

## 2015-12-02 LAB — LIPID PANEL
CHOLESTEROL: 196 mg/dL (ref 0–200)
HDL: 49.3 mg/dL (ref >39.00)
LDL Cholesterol: 123 mg/dL — ABNORMAL HIGH (ref 0–99)
NonHDL: 146.91
Total CHOL/HDL Ratio: 4
Triglycerides: 121 mg/dL (ref 0.0–149.0)
VLDL: 24.2 mg/dL (ref 0.0–40.0)

## 2015-12-02 LAB — TSH: TSH: 0.67 u[IU]/mL (ref 0.35–4.50)

## 2015-12-02 MED ORDER — FLUTICASONE-SALMETEROL 100-50 MCG/DOSE IN AEPB: 1.0000 | INHALATION_SPRAY | Freq: Two times a day (BID) | RESPIRATORY_TRACT | 11 refills | Status: DC

## 2015-12-02 MED ORDER — ALBUTEROL SULFATE HFA 108 (90 BASE) MCG/ACT IN AERS: 2.0000 | INHALATION_SPRAY | RESPIRATORY_TRACT | 3 refills | Status: DC | PRN

## 2015-12-02 MED ORDER — TRAMADOL HCL 50 MG PO TABS: 50.0000 mg | ORAL_TABLET | Freq: Four times a day (QID) | ORAL | 2 refills | Status: DC | PRN

## 2015-12-02 NOTE — Progress Notes (Signed)
Pre visit review using our clinic review tool, if applicable. No additional management support is needed unless otherwise documented below in the visit note. 

## 2015-12-02 NOTE — Patient Instructions (Signed)
Insomnia Insomnia is a sleep disorder that makes it difficult to fall asleep or to stay asleep. Insomnia can cause tiredness (fatigue), low energy, difficulty concentrating, mood swings, and poor performance at work or school.  There are three different ways to classify insomnia:  Difficulty falling asleep.  Difficulty staying asleep.  Waking up too early in the morning. Any type of insomnia can be long-term (chronic) or short-term (acute). Both are common. Short-term insomnia usually lasts for three months or less. Chronic insomnia occurs at least three times a week for longer than three months. CAUSES  Insomnia may be caused by another condition, situation, or substance, such as:  Anxiety.  Certain medicines.  Gastroesophageal reflux disease (GERD) or other gastrointestinal conditions.  Asthma or other breathing conditions.  Restless legs syndrome, sleep apnea, or other sleep disorders.  Chronic pain.  Menopause. This may include hot flashes.  Stroke.  Abuse of alcohol, tobacco, or illegal drugs.  Depression.  Caffeine.   Neurological disorders, such as Alzheimer disease.  An overactive thyroid (hyperthyroidism). The cause of insomnia may not be known. RISK FACTORS Risk factors for insomnia include:  Gender. Women are more commonly affected than men.  Age. Insomnia is more common as you get older.  Stress. This may involve your professional or personal life.  Income. Insomnia is more common in people with lower income.  Lack of exercise.   Irregular work schedule or night shifts.  Traveling between different time zones. SIGNS AND SYMPTOMS If you have insomnia, trouble falling asleep or trouble staying asleep is the main symptom. This may lead to other symptoms, such as:  Feeling fatigued.  Feeling nervous about going to sleep.  Not feeling rested in the morning.  Having trouble concentrating.  Feeling irritable, anxious, or depressed. TREATMENT   Treatment for insomnia depends on the cause. If your insomnia is caused by an underlying condition, treatment will focus on addressing the condition. Treatment may also include:   Medicines to help you sleep.  Counseling or therapy.  Lifestyle adjustments. HOME CARE INSTRUCTIONS   Take medicines only as directed by your health care provider.  Keep regular sleeping and waking hours. Avoid naps.  Keep a sleep diary to help you and your health care provider figure out what could be causing your insomnia. Include:   When you sleep.  When you wake up during the night.  How well you sleep.   How rested you feel the next day.  Any side effects of medicines you are taking.  What you eat and drink.   Make your bedroom a comfortable place where it is easy to fall asleep:  Put up shades or special blackout curtains to block light from outside.  Use a white noise machine to block noise.  Keep the temperature cool.   Exercise regularly as directed by your health care provider. Avoid exercising right before bedtime.  Use relaxation techniques to manage stress. Ask your health care provider to suggest some techniques that may work well for you. These may include:  Breathing exercises.  Routines to release muscle tension.  Visualizing peaceful scenes.  Cut back on alcohol, caffeinated beverages, and cigarettes, especially close to bedtime. These can disrupt your sleep.  Do not overeat or eat spicy foods right before bedtime. This can lead to digestive discomfort that can make it hard for you to sleep.  Limit screen use before bedtime. This includes:  Watching TV.  Using your smartphone, tablet, and computer.  Stick to a routine. This   can help you fall asleep faster. Try to do a quiet activity, brush your teeth, and go to bed at the same time each night.  Get out of bed if you are still awake after 15 minutes of trying to sleep. Keep the lights down, but try reading or  doing a quiet activity. When you feel sleepy, go back to bed.  Make sure that you drive carefully. Avoid driving if you feel very sleepy.  Keep all follow-up appointments as directed by your health care provider. This is important. SEEK MEDICAL CARE IF:   You are tired throughout the day or have trouble in your daily routine due to sleepiness.  You continue to have sleep problems or your sleep problems get worse. SEEK IMMEDIATE MEDICAL CARE IF:   You have serious thoughts about hurting yourself or someone else.   This information is not intended to replace advice given to you by your health care provider. Make sure you discuss any questions you have with your health care provider.   Document Released: 01/06/2000 Document Revised: 09/29/2014 Document Reviewed: 10/09/2013 Elsevier Interactive Patient Education 2016 [employer].  Consider Melatonin trial for insomnia.
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
## 2015-12-04 ENCOUNTER — Encounter: Payer: Self-pay | Admitting: Family Medicine

## 2015-12-30 ENCOUNTER — Encounter (HOSPITAL_COMMUNITY): Payer: Self-pay | Admitting: Emergency Medicine

## 2015-12-30 ENCOUNTER — Emergency Department (HOSPITAL_COMMUNITY)
Admission: EM | Admit: 2015-12-30 | Discharge: 2015-12-30 | Disposition: A | Discharge status: Home / Self Care | Payer: Commercial Managed Care - HMO | Attending: Physician Assistant | Admitting: Physician Assistant

## 2015-12-30 DIAGNOSIS — M75102 Unspecified rotator cuff tear or rupture of left shoulder, not specified as traumatic: Secondary | ICD-10-CM

## 2015-12-30 DIAGNOSIS — Y929 Unspecified place or not applicable: Secondary | ICD-10-CM | POA: Diagnosis not present

## 2015-12-30 DIAGNOSIS — M25512 Pain in left shoulder: Secondary | ICD-10-CM

## 2015-12-30 DIAGNOSIS — Z79899 Other long term (current) drug therapy: Secondary | ICD-10-CM | POA: Diagnosis not present

## 2015-12-30 DIAGNOSIS — G8929 Other chronic pain: Secondary | ICD-10-CM

## 2015-12-30 DIAGNOSIS — S46912A Strain of unspecified muscle, fascia and tendon at shoulder and upper arm level, left arm, initial encounter: Secondary | ICD-10-CM

## 2015-12-30 DIAGNOSIS — Y999 Unspecified external cause status: Secondary | ICD-10-CM | POA: Diagnosis not present

## 2015-12-30 DIAGNOSIS — Y939 Activity, unspecified: Secondary | ICD-10-CM | POA: Diagnosis not present

## 2015-12-30 DIAGNOSIS — I10 Essential (primary) hypertension: Secondary | ICD-10-CM | POA: Diagnosis not present

## 2015-12-30 DIAGNOSIS — S46012A Strain of muscle(s) and tendon(s) of the rotator cuff of left shoulder, initial encounter: Secondary | ICD-10-CM | POA: Diagnosis not present

## 2015-12-30 DIAGNOSIS — X58XXXA Exposure to other specified factors, initial encounter: Secondary | ICD-10-CM | POA: Insufficient documentation

## 2015-12-30 DIAGNOSIS — S4992XA Unspecified injury of left shoulder and upper arm, initial encounter: Secondary | ICD-10-CM | POA: Diagnosis present

## 2015-12-30 DIAGNOSIS — J45909 Unspecified asthma, uncomplicated: Secondary | ICD-10-CM | POA: Diagnosis not present

## 2015-12-30 MED ORDER — CYCLOBENZAPRINE HCL 10 MG PO TABS: 10.0000 mg | ORAL_TABLET | Freq: Three times a day (TID) | ORAL | 0 refills | Status: DC | PRN
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
## 2015-12-30 NOTE — Discharge Instructions (Signed)
Rest your shoulder for the next 24 hours then begin performing gentle range of motion exercises. Alternate between ice and heat on your shoulder throughout the day, using an ice or heat pack for 20 minutes at a time every hour. Use ibuprofen or aleve daily to help with pain (take zantac with it to help protect your stomach lining). Use tylenol and/or your home tramadol as needed for additional pain relief. Use flexeril as needed for muscle spasms. Do not drive or operate machinery with pain medication or muscle relaxant use. Follow up with your orthopedist or your sports medicine doctor in the next 1-2 weeks for ongoing management of your shoulder pain. Return to the ER for changes or worsening symptoms.

## 2015-12-30 NOTE — ED Triage Notes (Signed)
Patient reports left shoulder pain x2 days. Denies recent injury. Reports taking Advil and tramadol with no relief.

## 2016-01-29 ENCOUNTER — Ambulatory Visit (HOSPITAL_COMMUNITY)
Admission: EM | Admit: 2016-01-29 | Discharge: 2016-01-29 | Disposition: A | Discharge status: Home / Self Care | Payer: Commercial Managed Care - HMO | Attending: Family Medicine | Admitting: Family Medicine

## 2016-01-29 ENCOUNTER — Ambulatory Visit (INDEPENDENT_AMBULATORY_CARE_PROVIDER_SITE_OTHER): Payer: Commercial Managed Care - HMO

## 2016-01-29 ENCOUNTER — Ambulatory Visit (HOSPITAL_COMMUNITY): Payer: Commercial Managed Care - HMO

## 2016-01-29 ENCOUNTER — Encounter (HOSPITAL_COMMUNITY): Payer: Self-pay | Admitting: Emergency Medicine

## 2016-01-29 DIAGNOSIS — J0100 Acute maxillary sinusitis, unspecified: Secondary | ICD-10-CM

## 2016-01-29 DIAGNOSIS — R05 Cough: Secondary | ICD-10-CM | POA: Diagnosis not present

## 2016-01-29 MED ORDER — AZITHROMYCIN 250 MG PO TABS: ORAL_TABLET | ORAL | 0 refills | Status: DC

## 2016-01-29 MED ORDER — GUAIFENESIN-CODEINE 100-10 MG/5ML PO SYRP: 10.0000 mL | ORAL_SOLUTION | Freq: Four times a day (QID) | ORAL | 0 refills | Status: DC | PRN

## 2016-01-29 MED ORDER — IPRATROPIUM BROMIDE 0.06 % NA SOLN: 2.0000 | Freq: Four times a day (QID) | NASAL | 1 refills | Status: DC

## 2016-01-29 NOTE — ED Triage Notes (Signed)
PT reports URI symptoms for 2-3 weeks. PT has tried several OTC treatments. PT reports severe cough, sore throat, and chest pain with cough this week.

## 2016-01-29 NOTE — Discharge Instructions (Signed)
Drink plenty of fluids as discussed, use medicine as prescribed, and mucinex or delsym for cough. Return or see your doctor if further problems °
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
## 2016-03-06 ENCOUNTER — Other Ambulatory Visit: Payer: Self-pay | Admitting: Family Medicine

## 2016-03-06 NOTE — Telephone Encounter (Signed)
Refill with 2 additional refills. 

## 2016-03-06 NOTE — Telephone Encounter (Signed)
Last refill given on 12/02/2015 #120 with 2 ref

## 2016-06-02 ENCOUNTER — Other Ambulatory Visit: Payer: Self-pay | Admitting: Family Medicine

## 2016-06-04 NOTE — Telephone Encounter (Signed)
Refill with 2 additional refills. 

## 2016-06-04 NOTE — Telephone Encounter (Signed)
Last refill 03/06/16 and last office visit 12/02/15.  Okay to fill?

## 2016-06-08 ENCOUNTER — Ambulatory Visit: Payer: Self-pay

## 2016-06-08 ENCOUNTER — Ambulatory Visit (INDEPENDENT_AMBULATORY_CARE_PROVIDER_SITE_OTHER): Payer: Commercial Managed Care - HMO | Admitting: Family Medicine

## 2016-06-08 ENCOUNTER — Encounter: Payer: Self-pay | Admitting: Family Medicine

## 2016-06-08 VITALS — BP 140/88 | HR 119 | Ht 76.0 in | Wt 264.0 lb

## 2016-06-08 DIAGNOSIS — M216X9 Other acquired deformities of unspecified foot: Secondary | ICD-10-CM

## 2016-06-08 DIAGNOSIS — M79672 Pain in left foot: Secondary | ICD-10-CM | POA: Diagnosis not present

## 2016-06-08 DIAGNOSIS — M2141 Flat foot [pes planus] (acquired), right foot: Secondary | ICD-10-CM

## 2016-06-08 DIAGNOSIS — M2142 Flat foot [pes planus] (acquired), left foot: Secondary | ICD-10-CM

## 2016-06-08 DIAGNOSIS — M79671 Pain in right foot: Secondary | ICD-10-CM

## 2016-06-08 DIAGNOSIS — M722 Plantar fascial fibromatosis: Secondary | ICD-10-CM

## 2016-06-08 DIAGNOSIS — M2140 Flat foot [pes planus] (acquired), unspecified foot: Secondary | ICD-10-CM | POA: Insufficient documentation

## 2016-06-08 MED ORDER — MELOXICAM 15 MG PO TABS: 15.0000 mg | ORAL_TABLET | Freq: Every day | ORAL | 0 refills | Status: DC

## 2016-06-08 NOTE — Patient Instructions (Signed)
Good to see you  Ice bath 10 minutes at night Exercises 3 times a week.  Stretch, stretch stretch when you can.  [employer] orthotics "total support" online would be great put them any shoe you are wearing.  meloxicam daily for 10 days then as needed.  See me again in 3 weeks and if not better we will consider injection

## 2016-06-08 NOTE — Assessment & Plan Note (Signed)
I believe the patient does have pes planus bilaterally that is likely contribute into a significant amount of the pain. Then we discussed over-the-counter orthotics, home exercises, icing regimen. Patient also given prescription for oral anti-inflammatory. Patient will try to make adjustments. Follow-up again in 4 weeks.

## 2016-06-08 NOTE — Assessment & Plan Note (Signed)
Discussed over-the-counter orthotics and strengthening exercises.
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
## 2016-06-08 NOTE — Assessment & Plan Note (Signed)
Plantar Fascitis: We reviewed that stretching is critically important to the treatment of PF. Reviewed footwear. Rigid soles have been shown to help with PF. Night splints can help. Reviewed rehab of stretching and calf raises.  Could benefit from a corticosteroid injection, orthotics, or other measures if conservative treatment fails. RTC in 4 weeks  

## 2016-07-06 ENCOUNTER — Ambulatory Visit: Payer: Commercial Managed Care - HMO | Admitting: Family Medicine

## 2016-07-30 ENCOUNTER — Ambulatory Visit: Payer: Self-pay | Admitting: Family Medicine

## 2016-08-27 ENCOUNTER — Other Ambulatory Visit: Payer: Self-pay | Admitting: Family Medicine

## 2016-09-03 ENCOUNTER — Other Ambulatory Visit: Payer: Self-pay | Admitting: Family Medicine

## 2016-09-04 MED ORDER — OMEPRAZOLE 40 MG PO CPDR: 40.0000 mg | DELAYED_RELEASE_CAPSULE | Freq: Every day | ORAL | 0 refills | Status: DC

## 2016-09-04 NOTE — Telephone Encounter (Signed)
Left message on machine for patient to schedule an appointment before calling in a refill.

## 2016-09-04 NOTE — Telephone Encounter (Signed)
Last refill 06/04/16 and last office visit 11/1015 okay to fill?

## 2016-09-04 NOTE — Telephone Encounter (Signed)
Last refill

## 2016-09-04 NOTE — Telephone Encounter (Signed)
Refill once.  Needs office follow up. 

## 2016-09-05 NOTE — Telephone Encounter (Signed)
Pt has been sch for 09-14-16

## 2016-09-05 NOTE — Telephone Encounter (Signed)
lmom for pt to call back

## 2016-09-07 ENCOUNTER — Ambulatory Visit: Payer: Commercial Managed Care - HMO | Admitting: Family Medicine

## 2016-09-14 ENCOUNTER — Ambulatory Visit: Payer: Commercial Managed Care - HMO | Admitting: Family Medicine

## 2016-09-17 ENCOUNTER — Ambulatory Visit: Payer: Commercial Managed Care - HMO | Admitting: Family Medicine

## 2016-10-01 ENCOUNTER — Ambulatory Visit (INDEPENDENT_AMBULATORY_CARE_PROVIDER_SITE_OTHER): Payer: 59 | Admitting: Family Medicine

## 2016-10-01 ENCOUNTER — Encounter: Payer: Self-pay | Admitting: Family Medicine

## 2016-10-01 VITALS — BP 120/90 | HR 90 | Temp 98.9°F | Wt 259.8 lb

## 2016-10-01 DIAGNOSIS — Z0000 Encounter for general adult medical examination without abnormal findings: Secondary | ICD-10-CM | POA: Diagnosis not present

## 2016-10-01 DIAGNOSIS — Z23 Encounter for immunization: Secondary | ICD-10-CM | POA: Diagnosis not present

## 2016-10-01 MED ORDER — TRAMADOL HCL 50 MG PO TABS: 50.0000 mg | ORAL_TABLET | Freq: Four times a day (QID) | ORAL | 3 refills | Status: DC | PRN
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
## 2016-10-08 ENCOUNTER — Other Ambulatory Visit: Payer: 59

## 2016-10-11 ENCOUNTER — Other Ambulatory Visit: Payer: Self-pay | Admitting: Family Medicine

## 2016-10-11 ENCOUNTER — Encounter: Payer: Self-pay | Admitting: Family Medicine

## 2016-10-15 MED ORDER — OMEPRAZOLE 40 MG PO CPDR: 40.0000 mg | DELAYED_RELEASE_CAPSULE | Freq: Every day | ORAL | 1 refills | Status: DC

## 2016-10-23 ENCOUNTER — Other Ambulatory Visit: Payer: Self-pay | Admitting: Family Medicine

## 2017-01-22 ENCOUNTER — Other Ambulatory Visit: Payer: Self-pay | Admitting: Family Medicine

## 2017-02-01 ENCOUNTER — Other Ambulatory Visit: Payer: Self-pay | Admitting: Family Medicine

## 2017-02-01 NOTE — Telephone Encounter (Signed)
Last refill given at office visit 10/01/16.  Okay to fill?

## 2017-02-02 NOTE — Telephone Encounter (Signed)
Refill with 2 additional refills. 

## 2017-05-04 ENCOUNTER — Other Ambulatory Visit: Payer: Self-pay | Admitting: Family Medicine

## 2017-05-06 NOTE — Telephone Encounter (Signed)
Refill for 3 months. 

## 2017-05-06 NOTE — Telephone Encounter (Signed)
Last office visit  10/01/16 and last refill 02/05/16.  Okay to fill?

## 2017-05-17 ENCOUNTER — Ambulatory Visit (INDEPENDENT_AMBULATORY_CARE_PROVIDER_SITE_OTHER): Payer: 59 | Admitting: Family Medicine

## 2017-05-17 ENCOUNTER — Encounter: Payer: Self-pay | Admitting: Family Medicine

## 2017-05-17 VITALS — BP 140/88 | HR 87 | Temp 98.3°F | Ht 76.0 in | Wt 270.8 lb

## 2017-05-17 DIAGNOSIS — F419 Anxiety disorder, unspecified: Secondary | ICD-10-CM

## 2017-05-17 DIAGNOSIS — G8929 Other chronic pain: Secondary | ICD-10-CM

## 2017-05-17 DIAGNOSIS — M25511 Pain in right shoulder: Secondary | ICD-10-CM

## 2017-05-17 DIAGNOSIS — M25512 Pain in left shoulder: Secondary | ICD-10-CM

## 2017-05-17 DIAGNOSIS — R030 Elevated blood-pressure reading, without diagnosis of hypertension: Secondary | ICD-10-CM | POA: Diagnosis not present

## 2017-05-17 MED ORDER — SERTRALINE HCL 50 MG PO TABS: 50.0000 mg | ORAL_TABLET | Freq: Every day | ORAL | 5 refills | Status: DC
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
## 2017-05-29 ENCOUNTER — Other Ambulatory Visit: Payer: Self-pay | Admitting: Family Medicine

## 2017-05-29 NOTE — Telephone Encounter (Signed)
Last refill 05/07/17 and last office visit 05/17/17.  Okay to fill?

## 2017-05-30 NOTE — Telephone Encounter (Signed)
Refill for 3 months. 

## 2017-05-31 MED ORDER — TRAMADOL HCL 50 MG PO TABS: 50.0000 mg | ORAL_TABLET | Freq: Four times a day (QID) | ORAL | 2 refills | Status: DC | PRN

## 2017-06-21 ENCOUNTER — Ambulatory Visit: Payer: 59 | Admitting: Family Medicine

## 2017-06-21 DIAGNOSIS — Z0289 Encounter for other administrative examinations: Secondary | ICD-10-CM

## 2017-07-24 ENCOUNTER — Encounter: Payer: Self-pay | Admitting: Family Medicine

## 2017-07-24 ENCOUNTER — Ambulatory Visit: Payer: 59 | Admitting: Family Medicine

## 2017-07-24 VITALS — BP 140/98 | HR 85 | Temp 98.7°F | Wt 274.3 lb

## 2017-07-24 DIAGNOSIS — M549 Dorsalgia, unspecified: Secondary | ICD-10-CM

## 2017-07-24 DIAGNOSIS — R030 Elevated blood-pressure reading, without diagnosis of hypertension: Secondary | ICD-10-CM

## 2017-07-24 MED ORDER — METHYLPREDNISOLONE ACETATE 40 MG/ML IJ SUSP
40.0000 mg | Freq: Once | INTRAMUSCULAR | Status: AC
  Administered 2017-07-24: 40 mg via INTRA_ARTICULAR

## 2017-07-24 MED ORDER — METHOCARBAMOL 750 MG PO TABS: 750.0000 mg | ORAL_TABLET | Freq: Three times a day (TID) | ORAL | 1 refills | Status: DC | PRN

## 2017-07-24 NOTE — Patient Instructions (Addendum)
Trigger Point Injection Trigger points are areas where you have pain. A trigger point injection is a shot given in the trigger point to help relieve pain for a few days to a few months. Common places for trigger points include:  The neck.  The shoulders.  The upper back.  The lower back.  A trigger point injection will not cure long-lasting (chronic) pain permanently. These injections do not always work for every person, but for some people they can help to relieve pain for a few days to a few months. Tell a health care provider about:  Any allergies you have.  All medicines you are taking, including vitamins, herbs, eye drops, creams, and over-the-counter medicines.  Any problems you or family members have had with anesthetic medicines.  Any blood disorders you have.  Any surgeries you have had.  Any medical conditions you have. What are the risks? Generally, this is a safe procedure. However, problems may occur, including:  Infection.  Bleeding.  Allergic reaction to the injected medicine.  Irritation of the skin around the injection site.  What happens before the procedure?  Ask your health care provider about changing or stopping your regular medicines. This is especially important if you are taking diabetes medicines or blood thinners. What happens during the procedure?  Your health care provider will feel for trigger points. A marker may be used to circle the area for the injection.  The skin over the trigger point will be washed with a germ-killing (antiseptic) solution.  A thin needle is used for the shot. You may feel pain or a twitching feeling when the needle enters the trigger point.  A numbing solution may be injected into the trigger point. Sometimes a medicine to keep down swelling, redness, and warmth (inflammation) is also injected.  Your health care provider may move the needle around the area where the trigger point is located until the tightness  and twitching goes away.  After the injection, your health care provider may put gentle pressure over the injection site.  The injection site will be covered with a bandage (dressing). The procedure may vary among health care providers and hospitals. What happens after the procedure?  The dressing can be taken off in a few hours or as told by your health care provider.  You may feel sore and stiff for 1-2 days. This information is not intended to replace advice given to you by your health care provider. Make sure you discuss any questions you have with your health care provider. Document Released: 12/28/2010 Document Revised: 09/11/2015 Document Reviewed: 06/28/2014 Elsevier Interactive Patient Education  2018 [place].  Monitor blood pressure and be in touch if consistently > 140/90.
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
## 2017-07-29 ENCOUNTER — Telehealth: Payer: Self-pay | Admitting: Family Medicine

## 2017-07-29 DIAGNOSIS — M549 Dorsalgia, unspecified: Secondary | ICD-10-CM

## 2017-07-29 NOTE — Telephone Encounter (Signed)
Called and spoke to patient. Patient is aware that PT referral has been placed. Patient would like a note to be out of work for his part time job since it requires heavy lifting. Dr. Elease Hashimoto please advise.

## 2017-07-29 NOTE — Telephone Encounter (Signed)
Dr. Burchette please advise 

## 2017-07-29 NOTE — Telephone Encounter (Signed)
Since he is already on muscle relaxer, I would set up PT referral

## 2017-07-29 NOTE — Telephone Encounter (Unsigned)
Copied from [place] [phone]. Topic: Quick Communication - See Telephone Encounter >> Jul 29, 2017  9:47 AM Percell Belt A wrote: CRM for notification. See Telephone encounter for: 07/29/17.  Pt called in and stated that he rec'd a shot in his back last wed and he was told to call in if it did not help.  He stated that he is still in pain and would like to talk to [place] number [phone]

## 2017-07-29 NOTE — Telephone Encounter (Signed)
OK to produce note out- would check with him for specific dates but write out for 2 weeks.

## 2017-07-30 NOTE — Telephone Encounter (Signed)
Patient calling back, starting date of 07/29/17

## 2017-07-30 NOTE — Telephone Encounter (Signed)
Called patient to see what date he would like for his work letter to start on. No answer, left a message for patient to call back with the information. CRM created.

## 2017-07-30 NOTE — Telephone Encounter (Signed)
Called and left voicemail to inform patient his work note was ready to be picked up.

## 2017-08-05 ENCOUNTER — Ambulatory Visit: Payer: 59 | Attending: Family Medicine | Admitting: Physical Therapy

## 2017-08-28 ENCOUNTER — Other Ambulatory Visit: Payer: Self-pay | Admitting: Family Medicine

## 2017-08-29 NOTE — Telephone Encounter (Signed)
Rx done. 

## 2017-08-29 NOTE — Telephone Encounter (Signed)
Last refill given on 5/10 for #120 with 2 ref

## 2017-08-29 NOTE — Telephone Encounter (Signed)
Refills OK. 

## 2017-09-12 ENCOUNTER — Other Ambulatory Visit: Payer: Self-pay | Admitting: Family Medicine

## 2017-09-13 ENCOUNTER — Encounter: Payer: Self-pay | Admitting: Family Medicine

## 2017-09-13 ENCOUNTER — Ambulatory Visit (INDEPENDENT_AMBULATORY_CARE_PROVIDER_SITE_OTHER): Payer: 59 | Admitting: Family Medicine

## 2017-09-13 VITALS — BP 144/100 | HR 86 | Temp 98.4°F | Ht 76.25 in | Wt 275.0 lb

## 2017-09-13 DIAGNOSIS — Z0000 Encounter for general adult medical examination without abnormal findings: Secondary | ICD-10-CM

## 2017-09-13 DIAGNOSIS — Z125 Encounter for screening for malignant neoplasm of prostate: Secondary | ICD-10-CM | POA: Diagnosis not present

## 2017-09-13 DIAGNOSIS — Z23 Encounter for immunization: Secondary | ICD-10-CM | POA: Diagnosis not present

## 2017-09-13 LAB — LIPID PANEL
CHOL/HDL RATIO: 4
Cholesterol: 179 mg/dL (ref 0–200)
HDL: 47.1 mg/dL (ref >39.00)
LDL Cholesterol: 102 mg/dL — ABNORMAL HIGH (ref 0–99)
NONHDL: 131.48
TRIGLYCERIDES: 148 mg/dL (ref 0.0–149.0)
VLDL: 29.6 mg/dL (ref 0.0–40.0)

## 2017-09-13 LAB — BASIC METABOLIC PANEL
BUN: 19 mg/dL (ref 6–23)
CO2: 28 meq/L (ref 19–32)
CREATININE: 1.37 mg/dL (ref 0.40–1.50)
Calcium: 9.7 mg/dL (ref 8.4–10.5)
Chloride: 104 mEq/L (ref 96–112)
GFR: 72.59 mL/min (ref >60.00)
Glucose, Bld: 96 mg/dL (ref 70–99)
Potassium: 4.4 mEq/L (ref 3.5–5.1)
SODIUM: 139 meq/L (ref 135–145)

## 2017-09-13 LAB — CBC WITH DIFFERENTIAL/PLATELET
Basophils Absolute: 0.1 10*3/uL (ref 0.0–0.1)
Basophils Relative: 1.3 % (ref 0.0–3.0)
EOS PCT: 7.3 % — AB (ref 0.0–5.0)
Eosinophils Absolute: 0.5 10*3/uL (ref 0.0–0.7)
HEMATOCRIT: 43.4 % (ref 39.0–52.0)
Hemoglobin: 14.5 g/dL (ref 13.0–17.0)
LYMPHS ABS: 1.8 10*3/uL (ref 0.7–4.0)
LYMPHS PCT: 27.6 % (ref 12.0–46.0)
MCHC: 33.3 g/dL (ref 30.0–36.0)
MCV: 92.6 fl (ref 78.0–100.0)
MONOS PCT: 10.5 % (ref 3.0–12.0)
Monocytes Absolute: 0.7 10*3/uL (ref 0.1–1.0)
NEUTROS ABS: 3.5 10*3/uL (ref 1.4–7.7)
Neutrophils Relative %: 53.3 % (ref 43.0–77.0)
Platelets: 313 10*3/uL (ref 150.0–400.0)
RBC: 4.69 Mil/uL (ref 4.22–5.81)
RDW: 13.5 % (ref 11.5–15.5)
WBC: 6.6 10*3/uL (ref 4.0–10.5)

## 2017-09-13 LAB — HEPATIC FUNCTION PANEL
ALBUMIN: 4.6 g/dL (ref 3.5–5.2)
ALT: 27 U/L (ref 0–53)
AST: 27 U/L (ref 0–37)
Alkaline Phosphatase: 64 U/L (ref 39–117)
Bilirubin, Direct: 0.1 mg/dL (ref 0.0–0.3)
Total Bilirubin: 0.6 mg/dL (ref 0.2–1.2)
Total Protein: 7.2 g/dL (ref 6.0–8.3)

## 2017-09-13 LAB — PSA: PSA: 0.63 ng/mL (ref 0.10–4.00)

## 2017-09-13 LAB — TSH: TSH: 0.6 u[IU]/mL (ref 0.35–4.50)

## 2017-09-13 MED ORDER — AMLODIPINE BESYLATE 5 MG PO TABS: 5.0000 mg | ORAL_TABLET | Freq: Every day | ORAL | 11 refills | Status: DC

## 2017-09-13 MED ORDER — FLUTICASONE-SALMETEROL 250-50 MCG/DOSE IN AEPB: 1.0000 | INHALATION_SPRAY | Freq: Two times a day (BID) | RESPIRATORY_TRACT | 11 refills | Status: DC
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
## 2017-09-14 ENCOUNTER — Encounter: Payer: Self-pay | Admitting: Family Medicine

## 2017-09-30 ENCOUNTER — Other Ambulatory Visit: Payer: Self-pay | Admitting: Family Medicine

## 2017-09-30 NOTE — Telephone Encounter (Signed)
Refill with 2 additional refills. 

## 2017-09-30 NOTE — Telephone Encounter (Signed)
Last refill 08/29/17 and last office visit 09/13/17.  Okay to fill?

## 2017-10-01 NOTE — Telephone Encounter (Signed)
Rx done. 

## 2017-10-18 ENCOUNTER — Ambulatory Visit: Payer: 59 | Admitting: Family Medicine

## 2017-10-18 DIAGNOSIS — Z0289 Encounter for other administrative examinations: Secondary | ICD-10-CM

## 2017-10-25 ENCOUNTER — Ambulatory Visit: Payer: 59 | Admitting: Family Medicine

## 2017-10-25 DIAGNOSIS — Z0289 Encounter for other administrative examinations: Secondary | ICD-10-CM

## 2017-12-24 ENCOUNTER — Other Ambulatory Visit: Payer: Self-pay | Admitting: Family Medicine

## 2017-12-24 NOTE — Telephone Encounter (Signed)
Ultram refilled for 3 months.

## 2017-12-24 NOTE — Telephone Encounter (Signed)
Last OV 09/13/17, No future OV  Last filled 10/01/17, # 120 with 2 refills  OK to continue?

## 2018-01-26 DIAGNOSIS — J019 Acute sinusitis, unspecified: Secondary | ICD-10-CM | POA: Diagnosis not present

## 2018-02-09 ENCOUNTER — Other Ambulatory Visit: Payer: Self-pay | Admitting: Family Medicine

## 2018-03-20 ENCOUNTER — Other Ambulatory Visit: Payer: Self-pay | Admitting: Family Medicine

## 2018-03-21 ENCOUNTER — Other Ambulatory Visit: Payer: Self-pay | Admitting: Family Medicine

## 2018-03-24 NOTE — Telephone Encounter (Signed)
Last OV 09/13/17, No future OV  Last filled 12/24/17, # 120 with 2 refills  OK to continue?

## 2018-03-24 NOTE — Telephone Encounter (Signed)
Refill with 2 additional refills. 

## 2018-03-25 NOTE — Telephone Encounter (Signed)
Refill called in. 

## 2018-03-30 DIAGNOSIS — J0100 Acute maxillary sinusitis, unspecified: Secondary | ICD-10-CM | POA: Diagnosis not present

## 2018-04-18 ENCOUNTER — Other Ambulatory Visit: Payer: Self-pay | Admitting: Family Medicine

## 2018-06-09 ENCOUNTER — Other Ambulatory Visit: Payer: Self-pay

## 2018-06-09 ENCOUNTER — Ambulatory Visit (INDEPENDENT_AMBULATORY_CARE_PROVIDER_SITE_OTHER): Payer: 59 | Admitting: Family Medicine

## 2018-06-09 ENCOUNTER — Telehealth: Payer: Self-pay

## 2018-06-09 DIAGNOSIS — M25512 Pain in left shoulder: Secondary | ICD-10-CM

## 2018-06-09 DIAGNOSIS — G8929 Other chronic pain: Secondary | ICD-10-CM
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
## 2018-06-09 NOTE — Telephone Encounter (Signed)
Called patient and left voice message to see if he needed to come in or if he could do a Doxy telephone visit.  OK for P[facility] to discuss/advise/schedule patient  CRM Created.

## 2018-06-10 DIAGNOSIS — M7582 Other shoulder lesions, left shoulder: Secondary | ICD-10-CM | POA: Diagnosis not present

## 2018-06-11 ENCOUNTER — Encounter: Payer: Self-pay | Admitting: Family Medicine

## 2018-06-16 ENCOUNTER — Other Ambulatory Visit: Payer: Self-pay | Admitting: Family Medicine

## 2018-06-17 NOTE — Telephone Encounter (Signed)
Last OV 06/09/18, Next OV 09/19/18  Filled 03/25/18, # 120 with 2 refills

## 2018-08-05 ENCOUNTER — Other Ambulatory Visit: Payer: Self-pay | Admitting: Orthopedic Surgery

## 2018-08-05 ENCOUNTER — Other Ambulatory Visit (HOSPITAL_COMMUNITY): Payer: Self-pay | Admitting: Orthopedic Surgery

## 2018-08-05 DIAGNOSIS — M25512 Pain in left shoulder: Secondary | ICD-10-CM

## 2018-08-26 ENCOUNTER — Other Ambulatory Visit: Payer: Self-pay

## 2018-08-26 ENCOUNTER — Ambulatory Visit
Admission: RE | Admit: 2018-08-26 | Discharge: 2018-08-26 | Disposition: A | Discharge status: Home / Self Care | Payer: 59 | Source: Ambulatory Visit | Attending: Orthopedic Surgery | Admitting: Orthopedic Surgery

## 2018-08-26 DIAGNOSIS — M25512 Pain in left shoulder: Secondary | ICD-10-CM

## 2018-08-26 MED ORDER — IOPAMIDOL (ISOVUE-M 200) INJECTION 41%
13.0000 mL | Freq: Once | INTRAMUSCULAR | Status: AC
  Administered 2018-08-26: 13 mL via INTRA_ARTICULAR

## 2018-09-05 ENCOUNTER — Other Ambulatory Visit: Payer: Self-pay | Admitting: Family Medicine

## 2018-09-05 NOTE — Telephone Encounter (Signed)
Last OV 06/09/18, Next OV 09/19/18  Last filled 06/17/18, # 120 with 2 refills

## 2018-09-06 ENCOUNTER — Other Ambulatory Visit: Payer: Self-pay | Admitting: Family Medicine

## 2018-09-06 NOTE — Telephone Encounter (Signed)
We really need to set him up for every 3 month follow up.[name] board requirements are every 3 month review of PDMP, controlled med contract on file, and at least once yearly drug screen.  We need to get these in compliance if to remain on the Ultr[employer] regularly.  Set up early this week so we can discuss. Probably will need in office as he has not had drug screen.

## 2018-09-08 NOTE — Telephone Encounter (Signed)
Called patient and made him an in office appointment for tomorrow afternoon. Patient verbalized an understanding.

## 2018-09-09 ENCOUNTER — Other Ambulatory Visit: Payer: Self-pay

## 2018-09-09 ENCOUNTER — Ambulatory Visit (INDEPENDENT_AMBULATORY_CARE_PROVIDER_SITE_OTHER): Payer: 59 | Admitting: Family Medicine

## 2018-09-09 ENCOUNTER — Encounter: Payer: Self-pay | Admitting: Family Medicine

## 2018-09-09 VITALS — BP 144/92 | HR 111 | Temp 98.0°F | Ht 76.25 in | Wt 271.8 lb

## 2018-09-09 DIAGNOSIS — G8929 Other chronic pain: Secondary | ICD-10-CM | POA: Diagnosis not present

## 2018-09-09 DIAGNOSIS — M25512 Pain in left shoulder: Secondary | ICD-10-CM | POA: Diagnosis not present

## 2018-09-09 DIAGNOSIS — I10 Essential (primary) hypertension: Secondary | ICD-10-CM

## 2018-09-09 MED ORDER — AMLODIPINE BESYLATE 5 MG PO TABS: 5.0000 mg | ORAL_TABLET | Freq: Every day | ORAL | 3 refills | Status: DC

## 2018-09-09 MED ORDER — TRAMADOL HCL 50 MG PO TABS: 50.0000 mg | ORAL_TABLET | Freq: Four times a day (QID) | ORAL | 1 refills | Status: DC | PRN

## 2018-09-09 NOTE — Patient Instructions (Signed)
Get back on Amlodipine 5 mg once daily  Monitor blood pressure and be in touch if consistently 140/90.
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
## 2018-09-16 ENCOUNTER — Encounter: Payer: Self-pay | Admitting: Family Medicine

## 2018-09-19 ENCOUNTER — Ambulatory Visit (INDEPENDENT_AMBULATORY_CARE_PROVIDER_SITE_OTHER): Payer: 59 | Admitting: Family Medicine

## 2018-09-19 ENCOUNTER — Other Ambulatory Visit: Payer: Self-pay

## 2018-09-19 ENCOUNTER — Encounter: Payer: Self-pay | Admitting: Family Medicine

## 2018-09-19 VITALS — BP 130/74 | HR 102 | Temp 98.5°F | Ht 75.5 in | Wt 266.8 lb

## 2018-09-19 DIAGNOSIS — Z23 Encounter for immunization: Secondary | ICD-10-CM

## 2018-09-19 DIAGNOSIS — Z0000 Encounter for general adult medical examination without abnormal findings: Secondary | ICD-10-CM

## 2018-09-19 DIAGNOSIS — R5383 Other fatigue: Secondary | ICD-10-CM

## 2018-09-19 LAB — CBC WITH DIFFERENTIAL/PLATELET
Basophils Absolute: 0.1 10*3/uL (ref 0.0–0.1)
Basophils Relative: 1.7 % (ref 0.0–3.0)
Eosinophils Absolute: 0.3 10*3/uL (ref 0.0–0.7)
Eosinophils Relative: 4.7 % (ref 0.0–5.0)
HCT: 44 % (ref 39.0–52.0)
Hemoglobin: 14.8 g/dL (ref 13.0–17.0)
Lymphocytes Relative: 24.9 % (ref 12.0–46.0)
Lymphs Abs: 1.8 10*3/uL (ref 0.7–4.0)
MCHC: 33.7 g/dL (ref 30.0–36.0)
MCV: 93.2 fl (ref 78.0–100.0)
Monocytes Absolute: 0.6 10*3/uL (ref 0.1–1.0)
Monocytes Relative: 8.6 % (ref 3.0–12.0)
Neutro Abs: 4.2 10*3/uL (ref 1.4–7.7)
Neutrophils Relative %: 60.1 % (ref 43.0–77.0)
Platelets: 342 10*3/uL (ref 150.0–400.0)
RBC: 4.73 Mil/uL (ref 4.22–5.81)
RDW: 13.7 % (ref 11.5–15.5)
WBC: 7.1 10*3/uL (ref 4.0–10.5)

## 2018-09-19 LAB — BASIC METABOLIC PANEL
BUN: 16 mg/dL (ref 6–23)
CO2: 25 mEq/L (ref 19–32)
Calcium: 9.5 mg/dL (ref 8.4–10.5)
Chloride: 105 mEq/L (ref 96–112)
Creatinine, Ser: 1.24 mg/dL (ref 0.40–1.50)
GFR: 76.27 mL/min (ref >60.00)
Glucose, Bld: 93 mg/dL (ref 70–99)
Potassium: 4.6 mEq/L (ref 3.5–5.1)
Sodium: 139 mEq/L (ref 135–145)

## 2018-09-19 LAB — LIPID PANEL
Cholesterol: 198 mg/dL (ref 0–200)
HDL: 48.8 mg/dL (ref >39.00)
LDL Cholesterol: 117 mg/dL — ABNORMAL HIGH (ref 0–99)
NonHDL: 149.33
Total CHOL/HDL Ratio: 4
Triglycerides: 163 mg/dL — ABNORMAL HIGH (ref 0.0–149.0)
VLDL: 32.6 mg/dL (ref 0.0–40.0)

## 2018-09-19 LAB — HEPATIC FUNCTION PANEL
ALT: 27 U/L (ref 0–53)
AST: 17 U/L (ref 0–37)
Albumin: 4.5 g/dL (ref 3.5–5.2)
Alkaline Phosphatase: 80 U/L (ref 39–117)
Bilirubin, Direct: 0.1 mg/dL (ref 0.0–0.3)
Total Bilirubin: 0.4 mg/dL (ref 0.2–1.2)
Total Protein: 6.9 g/dL (ref 6.0–8.3)

## 2018-09-19 LAB — TSH: TSH: 0.77 u[IU]/mL (ref 0.35–4.50)

## 2018-09-19 LAB — PSA: PSA: 0.74 ng/mL (ref 0.10–4.00)

## 2018-09-19 LAB — TESTOSTERONE: Testosterone: 303.91 ng/dL (ref 300.00–890.00)
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
## 2018-09-19 NOTE — Addendum Note (Signed)
Addended by: Anibal Henderson on: 09/19/2018 09:38 AM   Modules accepted: Orders

## 2018-09-22 ENCOUNTER — Other Ambulatory Visit: Payer: Self-pay

## 2018-09-22 DIAGNOSIS — R5383 Other fatigue: Secondary | ICD-10-CM

## 2018-10-06 ENCOUNTER — Other Ambulatory Visit: Payer: Self-pay | Admitting: Family Medicine

## 2018-10-08 ENCOUNTER — Other Ambulatory Visit: Payer: Self-pay | Admitting: Family Medicine

## 2018-10-16 ENCOUNTER — Other Ambulatory Visit: Payer: Self-pay | Admitting: Family Medicine

## 2018-11-23 ENCOUNTER — Other Ambulatory Visit: Payer: Self-pay | Admitting: Family Medicine

## 2018-12-29 ENCOUNTER — Other Ambulatory Visit: Payer: Self-pay | Admitting: Family Medicine

## 2018-12-29 NOTE — Telephone Encounter (Signed)
[place] pt please advise

## 2018-12-29 NOTE — Telephone Encounter (Signed)
OK to continue this medication? 

## 2018-12-30 ENCOUNTER — Other Ambulatory Visit (INDEPENDENT_AMBULATORY_CARE_PROVIDER_SITE_OTHER): Payer: 59

## 2018-12-30 ENCOUNTER — Other Ambulatory Visit: Payer: Self-pay

## 2018-12-30 DIAGNOSIS — R5383 Other fatigue: Secondary | ICD-10-CM

## 2018-12-30 LAB — TESTOSTERONE: Testosterone: 365.27 ng/dL (ref 300.00–890.00)

## 2019-01-18 ENCOUNTER — Other Ambulatory Visit: Payer: Self-pay | Admitting: Family Medicine

## 2019-01-19 NOTE — Telephone Encounter (Signed)
Last Rx given on 12/7 for #60 with no ref

## 2019-01-26 ENCOUNTER — Ambulatory Visit: Payer: 59 | Attending: Internal Medicine

## 2019-01-26 DIAGNOSIS — Z20822 Contact with and (suspected) exposure to covid-19: Secondary | ICD-10-CM

## 2019-01-27 LAB — NOVEL CORONAVIRUS, NAA: SARS-CoV-2, NAA: NOT DETECTED

## 2019-03-20 ENCOUNTER — Other Ambulatory Visit: Payer: Self-pay

## 2019-03-20 ENCOUNTER — Telehealth (INDEPENDENT_AMBULATORY_CARE_PROVIDER_SITE_OTHER): Payer: 59 | Admitting: Family Medicine

## 2019-03-20 DIAGNOSIS — F5101 Primary insomnia: Secondary | ICD-10-CM

## 2019-03-20 DIAGNOSIS — S29012A Strain of muscle and tendon of back wall of thorax, initial encounter: Secondary | ICD-10-CM

## 2019-03-20 MED ORDER — TRAZODONE HCL 50 MG PO TABS: 25.0000 mg | ORAL_TABLET | Freq: Every evening | ORAL | 3 refills | Status: DC | PRN
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
## 2019-04-08 ENCOUNTER — Other Ambulatory Visit: Payer: Self-pay | Admitting: Family Medicine

## 2019-04-12 ENCOUNTER — Other Ambulatory Visit: Payer: Self-pay | Admitting: Family Medicine

## 2019-04-13 NOTE — Telephone Encounter (Signed)
Last ov:03/20/19 Last filled:01/19/2019

## 2019-04-17 ENCOUNTER — Ambulatory Visit: Payer: 59 | Attending: Internal Medicine

## 2019-04-17 DIAGNOSIS — Z23 Encounter for immunization: Secondary | ICD-10-CM

## 2019-04-17 NOTE — Progress Notes (Signed)
   Covid-19 Vaccination Clinic  Name:  [name]    MRN: [id number] DOB: 1975-08-20  04/17/2019  Mr. [name] was observed post Covid-19 immunization for 15 minutes without incident. He was provided with Vaccine Information Sheet and instruction to access the V-Safe system.   [name] was instructed to call 911 with any severe reactions post vaccine: Marland Kitchen Difficulty breathing  . Swelling of face and throat  . A fast heartbeat  . A bad rash all over body  . Dizziness and weakness   Immunizations Administered    Name Date Dose VIS Date Route   Pfizer COVID-19 Vaccine 04/17/2019  9:00 AM 0.3 mL 01/02/2019 Intramuscular   Manufacturer: [place]   Lot: [id number]   [place]: [id number]

## 2019-05-11 ENCOUNTER — Ambulatory Visit: Payer: 59 | Attending: Internal Medicine

## 2019-05-11 DIAGNOSIS — Z23 Encounter for immunization: Secondary | ICD-10-CM

## 2019-05-11 NOTE — Progress Notes (Signed)
   Covid-19 Vaccination Clinic  Name:  [name]    MRN: [id number] DOB: [birth year]  05/11/2019  [name] was observed post Covid-19 immunization for 15 minutes without incident. He was provided with Vaccine Information Sheet and instruction to access the V-Safe system.   [name] was instructed to call 911 with any severe reactions post vaccine: Marland Kitchen Difficulty breathing  . Swelling of face and throat  . A fast heartbeat  . A bad rash all over body  . Dizziness and weakness   Immunizations Administered    Name Date Dose VIS Date Route   Pfizer COVID-19 Vaccine 05/11/2019  2:30 PM 0.3 mL 03/18/2018 Intramuscular   Manufacturer: [place]   Lot: [id number]   [place]: [id number]

## 2019-07-12 ENCOUNTER — Other Ambulatory Visit: Payer: Self-pay | Admitting: Family Medicine

## 2019-07-13 NOTE — Telephone Encounter (Signed)
Last ov:03/20/19 Last filled:04/13/19

## 2019-08-20 ENCOUNTER — Telehealth (INDEPENDENT_AMBULATORY_CARE_PROVIDER_SITE_OTHER): Payer: 59 | Admitting: Internal Medicine

## 2019-08-20 ENCOUNTER — Encounter: Payer: Self-pay | Admitting: Internal Medicine

## 2019-08-20 ENCOUNTER — Other Ambulatory Visit: Payer: Self-pay

## 2019-08-20 VITALS — Ht 75.5 in | Wt 275.0 lb

## 2019-08-20 DIAGNOSIS — J4541 Moderate persistent asthma with (acute) exacerbation: Secondary | ICD-10-CM | POA: Diagnosis not present

## 2019-08-20 MED ORDER — PREDNISONE 20 MG PO TABS: ORAL_TABLET | ORAL | 0 refills | Status: DC
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
## 2019-09-15 ENCOUNTER — Other Ambulatory Visit: Payer: Self-pay | Admitting: Family Medicine

## 2019-09-19 ENCOUNTER — Other Ambulatory Visit: Payer: Self-pay | Admitting: Internal Medicine

## 2019-09-23 ENCOUNTER — Telehealth (INDEPENDENT_AMBULATORY_CARE_PROVIDER_SITE_OTHER): Payer: 59 | Admitting: Family Medicine

## 2019-09-23 DIAGNOSIS — J4541 Moderate persistent asthma with (acute) exacerbation: Secondary | ICD-10-CM | POA: Diagnosis not present

## 2019-09-23 DIAGNOSIS — R0981 Nasal congestion: Secondary | ICD-10-CM | POA: Diagnosis not present

## 2019-09-23 MED ORDER — PREDNISONE 20 MG PO TABS: ORAL_TABLET | ORAL | 0 refills | Status: DC

## 2019-09-23 MED ORDER — MONTELUKAST SODIUM 10 MG PO TABS: 10.0000 mg | ORAL_TABLET | Freq: Every day | ORAL | 3 refills | Status: DC
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
## 2019-10-11 ENCOUNTER — Other Ambulatory Visit: Payer: Self-pay | Admitting: Family Medicine

## 2019-10-16 ENCOUNTER — Other Ambulatory Visit: Payer: Self-pay | Admitting: Family Medicine

## 2019-11-06 ENCOUNTER — Ambulatory Visit: Payer: 59 | Admitting: Family Medicine

## 2019-11-06 ENCOUNTER — Other Ambulatory Visit: Payer: Self-pay

## 2019-11-06 ENCOUNTER — Encounter: Payer: Self-pay | Admitting: Family Medicine

## 2019-11-06 VITALS — BP 138/96 | HR 83 | Temp 98.4°F | Ht 75.5 in | Wt 278.3 lb

## 2019-11-06 DIAGNOSIS — Z0000 Encounter for general adult medical examination without abnormal findings: Secondary | ICD-10-CM | POA: Diagnosis not present

## 2019-11-06 DIAGNOSIS — Z23 Encounter for immunization: Secondary | ICD-10-CM | POA: Diagnosis not present

## 2019-11-06 MED ORDER — PREDNISONE 10 MG PO TABS: ORAL_TABLET | ORAL | 0 refills | Status: DC
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
## 2019-11-06 NOTE — Patient Instructions (Addendum)
  Take daily anti-histamine such as Zyrtec, Allegra, or Xyzal  Continue with daily Flonase   Monitor blood pressure and be in touch if consistently > 140/90.     If you have lab work done today you will be contacted with your lab results within the next 2 weeks.  If you have not heard from [place] then please contact us. The fastest way to get your results is to register for My Chart.   IF you received an x-ray today, you will receive an invoice from [facility]. Please contact [facility] at [phone] with questions or concerns regarding your invoice.   IF you received labwork today, you will receive an invoice from [facility]. Please contact [facility] at [phone] with questions or concerns regarding your invoice.   Our billing staff will not be able to assist you with questions regarding bills from these companies.  You will be contacted with the lab results as soon as they are available. The fastest way to get your results is to activate your My Chart account. Instructions are located on the last page of this paperwork. If you have not heard from [place] regarding the results in 2 weeks, please contact this office.

## 2019-11-09 LAB — CBC WITH DIFFERENTIAL/PLATELET
Absolute Monocytes: 831 cells/uL (ref 200–950)
Basophils Absolute: 57 cells/uL (ref 0–200)
Basophils Relative: 0.8 %
Eosinophils Absolute: 227 cells/uL (ref 15–500)
Eosinophils Relative: 3.2 %
HCT: 43.3 % (ref 38.5–50.0)
Hemoglobin: 14.8 g/dL (ref 13.2–17.1)
Lymphs Abs: 1825 cells/uL (ref 850–3900)
MCH: 31.4 pg (ref 27.0–33.0)
MCHC: 34.2 g/dL (ref 32.0–36.0)
MCV: 91.9 fL (ref 80.0–100.0)
MPV: 9.4 fL (ref 7.5–12.5)
Monocytes Relative: 11.7 %
Neutro Abs: 4161 cells/uL (ref 1500–7800)
Neutrophils Relative %: 58.6 %
Platelets: 318 10*3/uL (ref 140–400)
RBC: 4.71 10*6/uL (ref 4.20–5.80)
RDW: 12.5 % (ref 11.0–15.0)
Total Lymphocyte: 25.7 %
WBC: 7.1 10*3/uL (ref 3.8–10.8)

## 2019-11-09 LAB — BASIC METABOLIC PANEL
BUN: 22 mg/dL (ref 7–25)
CO2: 25 mmol/L (ref 20–32)
Calcium: 9.8 mg/dL (ref 8.6–10.3)
Chloride: 106 mmol/L (ref 98–110)
Creat: 1.21 mg/dL (ref 0.60–1.35)
Glucose, Bld: 91 mg/dL (ref 65–99)
Potassium: 4.2 mmol/L (ref 3.5–5.3)
Sodium: 140 mmol/L (ref 135–146)

## 2019-11-09 LAB — HEPATIC FUNCTION PANEL
AG Ratio: 1.9 (calc) (ref 1.0–2.5)
ALT: 30 U/L (ref 9–46)
AST: 18 U/L (ref 10–40)
Albumin: 4.5 g/dL (ref 3.6–5.1)
Alkaline phosphatase (APISO): 78 U/L (ref 36–130)
Bilirubin, Direct: 0.1 mg/dL (ref 0.0–0.2)
Globulin: 2.4 g/dL (calc) (ref 1.9–3.7)
Indirect Bilirubin: 0.2 mg/dL (calc) (ref 0.2–1.2)
Total Bilirubin: 0.3 mg/dL (ref 0.2–1.2)
Total Protein: 6.9 g/dL (ref 6.1–8.1)

## 2019-11-09 LAB — LIPID PANEL
Cholesterol: 185 mg/dL (ref <200)
HDL: 53 mg/dL (ref >=40)
LDL Cholesterol (Calc): 113 mg/dL (calc) — ABNORMAL HIGH
Non-HDL Cholesterol (Calc): 132 mg/dL (calc) — ABNORMAL HIGH (ref <130)
Total CHOL/HDL Ratio: 3.5 (calc) (ref <5.0)
Triglycerides: 89 mg/dL (ref <150)

## 2019-11-09 LAB — HEPATITIS C ANTIBODY
Hepatitis C Ab: NONREACTIVE
SIGNAL TO CUT-OFF: 0.01 (ref <1.00)

## 2019-11-09 LAB — TSH: TSH: 0.5 mIU/L (ref 0.40–4.50)

## 2019-11-09 LAB — PSA: PSA: 0.39 ng/mL (ref <=4.0)

## 2019-11-09 LAB — SPECIMEN COMPROMISED

## 2019-11-11 ENCOUNTER — Other Ambulatory Visit: Payer: Self-pay | Admitting: Family Medicine

## 2019-11-14 ENCOUNTER — Other Ambulatory Visit: Payer: Self-pay | Admitting: Family Medicine

## 2019-11-20 ENCOUNTER — Other Ambulatory Visit: Payer: Self-pay | Admitting: *Deleted

## 2019-11-20 DIAGNOSIS — J4541 Moderate persistent asthma with (acute) exacerbation: Secondary | ICD-10-CM

## 2019-11-20 MED ORDER — PREDNISONE 10 MG PO TABS: ORAL_TABLET | ORAL | 0 refills | Status: DC

## 2019-11-21 NOTE — Telephone Encounter (Signed)
May refill once, but we cannot take this (prednisone) regularly for allergy issues.

## 2019-12-23 ENCOUNTER — Ambulatory Visit: Payer: 59 | Admitting: Family Medicine

## 2020-01-05 ENCOUNTER — Other Ambulatory Visit: Payer: Self-pay | Admitting: Family Medicine

## 2020-01-06 ENCOUNTER — Other Ambulatory Visit: Payer: Self-pay | Admitting: Family Medicine

## 2020-01-22 ENCOUNTER — Other Ambulatory Visit: Payer: Self-pay | Admitting: Family Medicine

## 2020-01-22 DIAGNOSIS — J4541 Moderate persistent asthma with (acute) exacerbation: Secondary | ICD-10-CM

## 2020-02-17 ENCOUNTER — Other Ambulatory Visit: Payer: Self-pay | Admitting: Family Medicine

## 2020-02-24 ENCOUNTER — Encounter: Payer: Self-pay | Admitting: Family Medicine

## 2020-02-24 ENCOUNTER — Ambulatory Visit: Payer: 59 | Admitting: Family Medicine

## 2020-02-24 ENCOUNTER — Other Ambulatory Visit: Payer: Self-pay

## 2020-02-24 VITALS — BP 140/110 | HR 105 | Temp 98.7°F | Wt 277.5 lb

## 2020-02-24 DIAGNOSIS — I10 Essential (primary) hypertension: Secondary | ICD-10-CM

## 2020-02-24 DIAGNOSIS — M79671 Pain in right foot: Secondary | ICD-10-CM | POA: Diagnosis not present

## 2020-02-24 DIAGNOSIS — M7711 Lateral epicondylitis, right elbow: Secondary | ICD-10-CM | POA: Diagnosis not present

## 2020-02-24 MED ORDER — LOSARTAN POTASSIUM-HCTZ 50-12.5 MG PO TABS: 1.0000 | ORAL_TABLET | Freq: Every day | ORAL | 1 refills | Status: DC

## 2020-02-24 NOTE — Patient Instructions (Signed)
Try some icing 20 minutes 3-4 times daily to arm and foot  Consider over the counter Diclofenac/Voltaren gel.
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
## 2020-02-25 MED ORDER — METHYLPREDNISOLONE ACETATE 40 MG/ML IJ SUSP
40.0000 mg | Freq: Once | INTRAMUSCULAR | Status: AC
  Administered 2020-02-24: 40 mg via INTRAMUSCULAR

## 2020-02-25 NOTE — Addendum Note (Signed)
Addended by: Amado Coe on: 02/25/2020 11:20 AM   Modules accepted: Orders

## 2020-03-13 ENCOUNTER — Other Ambulatory Visit: Payer: Self-pay | Admitting: Family Medicine

## 2020-04-08 ENCOUNTER — Other Ambulatory Visit: Payer: Self-pay | Admitting: Family Medicine

## 2020-04-08 NOTE — Telephone Encounter (Signed)
Last filled 03/12/20

## 2020-04-25 ENCOUNTER — Ambulatory Visit: Payer: 59 | Admitting: Family Medicine

## 2020-04-25 ENCOUNTER — Encounter: Payer: Self-pay | Admitting: Family Medicine

## 2020-04-25 ENCOUNTER — Other Ambulatory Visit: Payer: Self-pay

## 2020-04-25 VITALS — BP 152/90 | HR 97 | Temp 97.9°F | Wt 282.0 lb

## 2020-04-25 DIAGNOSIS — I10 Essential (primary) hypertension: Secondary | ICD-10-CM

## 2020-04-25 DIAGNOSIS — Z1211 Encounter for screening for malignant neoplasm of colon: Secondary | ICD-10-CM | POA: Diagnosis not present

## 2020-04-25 DIAGNOSIS — J310 Chronic rhinitis: Secondary | ICD-10-CM | POA: Diagnosis not present

## 2020-04-25 MED ORDER — LOSARTAN POTASSIUM-HCTZ 50-12.5 MG PO TABS: 1.0000 | ORAL_TABLET | Freq: Every day | ORAL | 3 refills | Status: DC

## 2020-04-25 MED ORDER — AMOXICILLIN-POT CLAVULANATE 875-125 MG PO TABS: 1.0000 | ORAL_TABLET | Freq: Two times a day (BID) | ORAL | 0 refills | Status: DC

## 2020-04-25 MED ORDER — AMLODIPINE BESYLATE 5 MG PO TABS: 1.0000 | ORAL_TABLET | Freq: Every day | ORAL | 3 refills | Status: DC
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
## 2020-05-13 ENCOUNTER — Other Ambulatory Visit: Payer: Self-pay | Admitting: Family Medicine

## 2020-06-08 ENCOUNTER — Encounter: Payer: Self-pay | Admitting: Gastroenterology

## 2020-06-21 ENCOUNTER — Ambulatory Visit (AMBULATORY_SURGERY_CENTER): Payer: 59 | Admitting: *Deleted

## 2020-06-21 ENCOUNTER — Other Ambulatory Visit: Payer: Self-pay

## 2020-06-21 VITALS — Ht 75.5 in | Wt 270.0 lb

## 2020-06-21 DIAGNOSIS — Z1211 Encounter for screening for malignant neoplasm of colon: Secondary | ICD-10-CM

## 2020-06-21 MED ORDER — SUPREP BOWEL PREP KIT 17.5-3.13-1.6 GM/177ML PO SOLN
1.0000 | Freq: Once | ORAL | 0 refills | Status: AC
Start: 2020-06-21 — End: 2020-06-21

## 2020-06-21 NOTE — Progress Notes (Signed)

## 2020-07-03 ENCOUNTER — Other Ambulatory Visit: Payer: Self-pay | Admitting: Family Medicine

## 2020-07-04 ENCOUNTER — Encounter: Payer: Self-pay | Admitting: Gastroenterology

## 2020-07-04 ENCOUNTER — Telehealth: Payer: Self-pay

## 2020-07-04 ENCOUNTER — Other Ambulatory Visit: Payer: Self-pay

## 2020-07-04 ENCOUNTER — Ambulatory Visit (AMBULATORY_SURGERY_CENTER): Payer: 59 | Admitting: Gastroenterology

## 2020-07-04 VITALS — BP 143/88 | HR 73 | Temp 98.2°F | Resp 15 | Ht 75.5 in | Wt 270.0 lb

## 2020-07-04 DIAGNOSIS — Z1211 Encounter for screening for malignant neoplasm of colon: Secondary | ICD-10-CM

## 2020-07-04 DIAGNOSIS — K635 Polyp of colon: Secondary | ICD-10-CM

## 2020-07-04 DIAGNOSIS — D122 Benign neoplasm of ascending colon: Secondary | ICD-10-CM

## 2020-07-04 MED ORDER — SODIUM CHLORIDE 0.9 % IV SOLN: 500.0000 mL | Freq: Once | INTRAVENOUS | Status: DC

## 2020-07-04 NOTE — Progress Notes (Signed)
Called to room to assist during endoscopic procedure.  Patient ID and intended procedure confirmed with present staff. Received instructions for my participation in the procedure from the performing physician.  

## 2020-07-04 NOTE — Progress Notes (Signed)
pt tolerated well. VSS. awake and to recovery. Report given to RN.  

## 2020-07-04 NOTE — Patient Instructions (Signed)
Discharge instructions given. Handout on polyps. Refer to colo-rectal surgeon at appointment to be scheduled to evaluate and biopsy anal lesion. Resume previous medications. YOU HAD AN ENDOSCOPIC PROCEDURE TODAY AT [facility]:   Refer to the procedure report that was given to you for any specific questions about what was found during the examination.  If the procedure report does not answer your questions, please call your gastroenterologist to clarify.  If you requested that your care partner not be given the details of your procedure findings, then the procedure report has been included in a sealed envelope for you to review at your convenience later.  YOU SHOULD EXPECT: Some feelings of bloating in the abdomen. Passage of more gas than usual.  Walking can help get rid of the air that was put into your GI tract during the procedure and reduce the bloating. If you had a lower endoscopy (such as a colonoscopy or flexible sigmoidoscopy) you may notice spotting of blood in your stool or on the toilet paper. If you underwent a bowel prep for your procedure, you may not have a normal bowel movement for a few days.  Please Note:  You might notice some irritation and congestion in your nose or some drainage.  This is from the oxygen used during your procedure.  There is no need for concern and it should clear up in a day or so.  SYMPTOMS TO REPORT IMMEDIATELY:  Following lower endoscopy (colonoscopy or flexible sigmoidoscopy):  Excessive amounts of blood in the stool  Significant tenderness or worsening of abdominal pains  Swelling of the abdomen that is new, acute  Fever of 100F or higher   For urgent or emergent issues, a gastroenterologist can be reached at any hour by calling [phone]. Do not use MyChart messaging for urgent concerns.    DIET:  We do recommend a small meal at first, but then you may proceed to your regular diet.  Drink plenty of fluids but you should  avoid alcoholic beverages for 24 hours.  ACTIVITY:  You should plan to take it easy for the rest of today and you should NOT DRIVE or use heavy machinery until tomorrow (because of the sedation medicines used during the test).    FOLLOW UP: Our staff will call the number listed on your records 48-72 hours following your procedure to check on you and address any questions or concerns that you may have regarding the information given to you following your procedure. If we do not reach you, we will leave a message.  We will attempt to reach you two times.  During this call, we will ask if you have developed any symptoms of COVID 19. If you develop any symptoms (ie: fever, flu-like symptoms, shortness of breath, cough etc.) before then, please call [phone].  If you test positive for Covid 19 in the 2 weeks post procedure, please call and report this information to [place].    If any biopsies were taken you will be contacted by phone or by letter within the next 1-3 weeks.  Please call us at [phone] if you have not heard about the biopsies in 3 weeks.    SIGNATURES/CONFIDENTIALITY: You and/or your care partner have signed paperwork which will be entered into your electronic medical record.  These signatures attest to the fact that that the information above on your After Visit Summary has been reviewed and is understood.  Full responsibility of the confidentiality of this discharge information lies  with you and/or your care-partner.  

## 2020-07-04 NOTE — Telephone Encounter (Signed)
Last filled 04/06/2020 Last OV 04/25/2020  Ok to fill?

## 2020-07-04 NOTE — Telephone Encounter (Signed)
Referral, records, demographic, and insurance information faxed to [facility].

## 2020-07-04 NOTE — Telephone Encounter (Signed)
-----   Message from Doran Stabler, MD sent at 07/04/2020  4:43 PM EDT ----- Please send a referral to Dr. Annye English at [facility] to evaluate anal lesion found on screening colonoscopy  - HD

## 2020-07-04 NOTE — Progress Notes (Signed)
Pt's states no medical or surgical changes since previsit or office visit. 

## 2020-07-04 NOTE — Op Note (Signed)
[place] Patient Name: [name] Procedure Date: 07/04/2020 2:58 PM MRN: [id number] Endoscopist: [place]. Loletha Carrow , MD Age: 45 Referring MD:  Date of Birth: 12/18/1975 Gender: Male Account #: [id number] Procedure:                Colonoscopy Indications:              Screening for colorectal malignant neoplasm, This                            is the patient's first colonoscopy Medicines:                Monitored Anesthesia Care Procedure:                Pre-Anesthesia Assessment:                           - Prior to the procedure, a History and Physical                            was performed, and patient medications and                            allergies were reviewed. The patient's tolerance of                            previous anesthesia was also reviewed. The risks                            and benefits of the procedure and the sedation                            options and risks were discussed with the patient.                            All questions were answered, and informed consent                            was obtained. Prior Anticoagulants: The patient has                            taken no previous anticoagulant or antiplatelet                            agents. ASA Grade Assessment: II - A patient with                            mild systemic disease. After reviewing the risks                            and benefits, the patient was deemed in                            satisfactory condition to undergo the procedure.  After obtaining informed consent, the colonoscope                            was passed under direct vision. Throughout the                            procedure, the patient's blood pressure, pulse, and                            oxygen saturations were monitored continuously. The                            Olympus CF-HQ190L [id number]) Colonoscope was                            introduced through the anus  and advanced to the the                            cecum, identified by appendiceal orifice and                            ileocecal valve. The colonoscopy was performed                            without difficulty. The patient tolerated the                            procedure well. The quality of the bowel                            preparation was good. The ileocecal valve,                            appendiceal orifice, and rectum were photographed. Scope In: 3:11:37 PM Scope Out: 3:33:02 PM Scope Withdrawal Time: 0 hours 16 minutes 26 seconds  Total Procedure Duration: 0 hours 21 minutes 25 seconds  Findings:                 The digital rectal exam findings include palpable                            soft , mobile, left lateral lesion.                           A 8 mm polyp was found in the hepatic flexure. The                            polyp was semi-sessile. The polyp was removed with                            a piecemeal technique using a cold snare. Resection                            and retrieval were complete.  An anal papilla was hypertrophied (corresponding to                            the palpable lesion on DRE). Surface with white                            mucosa - ? AIN                           The exam was otherwise without abnormality on                            direct and retroflexion views. Complications:            No immediate complications. Estimated Blood Loss:     Estimated blood loss was minimal. Impression:               - Palpable soft , mobile, left lateral lesion found                            on digital rectal exam.                           - One 8 mm polyp at the hepatic flexure, removed                            piecemeal using a cold snare. Resected and                            retrieved.                           - Anal papilla(e) were hypertrophied.                           - The examination was otherwise  normal on direct                            and retroflexion views. Recommendation:           - Patient has a contact number available for                            emergencies. The signs and symptoms of potential                            delayed complications were discussed with the                            patient. Return to normal activities tomorrow.                            Written discharge instructions were provided to the                            patient.                           -  Resume previous diet.                           - Continue present medications.                           - Await pathology results.                           - Repeat colonoscopy is recommended for                            surveillance. The colonoscopy date will be                            determined after pathology results from today's                            exam become available for review.                           - Refer to a colo-rectal surgeon at appointment to                            be scheduled to evaluate and biopsy anal lesion. Elior Robinette L. Loletha Carrow, MD 07/04/2020 3:39:43 PM This report has been signed electronically.

## 2020-07-05 ENCOUNTER — Telehealth: Payer: Self-pay | Admitting: *Deleted

## 2020-07-05 NOTE — Telephone Encounter (Signed)
Patients referral documents faxed to [facility] at [phone].

## 2020-07-06 ENCOUNTER — Telehealth: Payer: Self-pay

## 2020-07-06 NOTE — Telephone Encounter (Signed)
Left message on 2nd follow up call. 

## 2020-07-08 ENCOUNTER — Encounter: Payer: Self-pay | Admitting: Gastroenterology

## 2020-08-30 ENCOUNTER — Encounter: Payer: Self-pay | Admitting: Family Medicine

## 2020-08-31 MED ORDER — LORAZEPAM 0.5 MG PO TABS: ORAL_TABLET | ORAL | 0 refills | Status: AC

## 2020-08-31 NOTE — Telephone Encounter (Signed)
I sent in lorazepam 0.5 mg take 1 to 2 tablets 1 hour prior to flying

## 2020-09-26 ENCOUNTER — Other Ambulatory Visit: Payer: Self-pay | Admitting: Family Medicine

## 2020-09-27 NOTE — Telephone Encounter (Signed)
Last office visit- 04/25/20 Last refill- 07/05/20--120 tabs, 2 refills  Next office visit- 09/28/20.   Can this patient receive a refill?

## 2020-09-28 ENCOUNTER — Ambulatory Visit: Payer: 59 | Admitting: Family Medicine

## 2020-09-28 ENCOUNTER — Other Ambulatory Visit: Payer: Self-pay

## 2020-09-28 VITALS — BP 130/90 | HR 98 | Temp 97.7°F | Wt 278.8 lb

## 2020-09-28 DIAGNOSIS — F5104 Psychophysiologic insomnia: Secondary | ICD-10-CM | POA: Diagnosis not present

## 2020-09-28 DIAGNOSIS — R5383 Other fatigue: Secondary | ICD-10-CM | POA: Diagnosis not present

## 2020-09-28 DIAGNOSIS — R400 Somnolence: Secondary | ICD-10-CM

## 2020-09-28 DIAGNOSIS — R0683 Snoring: Secondary | ICD-10-CM | POA: Diagnosis not present

## 2020-09-28 NOTE — Patient Instructions (Signed)
Go ahead and increase the Trazodone to two at night (100 mg)  I will set up referral to evaluate for sleep apnea.
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
## 2020-11-28 ENCOUNTER — Other Ambulatory Visit: Payer: Self-pay

## 2020-11-28 ENCOUNTER — Ambulatory Visit (INDEPENDENT_AMBULATORY_CARE_PROVIDER_SITE_OTHER): Payer: Managed Care, Other (non HMO) | Admitting: Family Medicine

## 2020-11-28 VITALS — BP 130/88 | HR 77 | Temp 98.4°F | Wt 273.0 lb

## 2020-11-28 DIAGNOSIS — M5412 Radiculopathy, cervical region: Secondary | ICD-10-CM | POA: Diagnosis not present

## 2020-11-28 DIAGNOSIS — Z23 Encounter for immunization: Secondary | ICD-10-CM | POA: Diagnosis not present

## 2020-11-28 DIAGNOSIS — F419 Anxiety disorder, unspecified: Secondary | ICD-10-CM

## 2020-11-28 DIAGNOSIS — I10 Essential (primary) hypertension: Secondary | ICD-10-CM | POA: Diagnosis not present

## 2020-11-28 MED ORDER — PREDNISONE 20 MG PO TABS: ORAL_TABLET | ORAL | 0 refills | Status: DC

## 2020-11-28 MED ORDER — SERTRALINE HCL 50 MG PO TABS: 50.0000 mg | ORAL_TABLET | Freq: Every day | ORAL | 3 refills | Status: DC
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
## 2020-12-18 ENCOUNTER — Other Ambulatory Visit: Payer: Self-pay | Admitting: Family Medicine

## 2020-12-19 ENCOUNTER — Other Ambulatory Visit: Payer: Self-pay | Admitting: Family Medicine

## 2020-12-19 NOTE — Telephone Encounter (Signed)
Last filled 09/27/2020 Last OV 11/28/2020  Ok to fill?

## 2021-01-05 ENCOUNTER — Telehealth: Payer: Self-pay | Admitting: Family Medicine

## 2021-01-05 NOTE — Telephone Encounter (Signed)
Patient called to ask for notes, where he was treated by Dr.Burchette for a pinched nerve, to be sent to [facility], for insurance purposes to get MRI. I instructed patient that he will need a medical release for [place] to fax it over and instructed him on ways he could fill out this release form. Patient verbalized understanding

## 2021-01-06 NOTE — Telephone Encounter (Signed)
Noted. Will wait for release form to be completed.

## 2021-02-06 IMAGING — MR MRI OF THE LEFT SHOULDER WITH CONTRAST
5 series · 40 of 40 positions shown · IV contrast (agent unspecified)
Comparison: MRI 03/30/2011

CLINICAL DATA: Left shoulder pain

EXAM:
MR ARTHROGRAM OF THE left SHOULDER
TECHNIQUE: Multiplanar, multisequence MR imaging of the left shoulder was
performed following the administration of intra-articular contrast.
CONTRAST:  See Injection Documentation.

[Series 4: T1 fat-sat · axial · 4.0mm · 0.27mm/px · z∈[-39,+59]mm · 8 of 21 slices shown (1 of 3)]
[im 1/21]
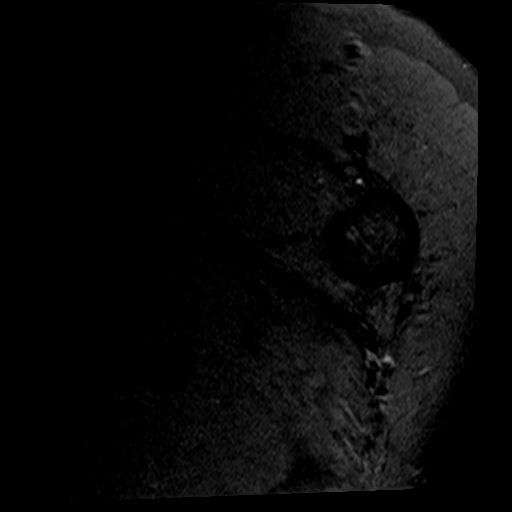
[im 3/21]
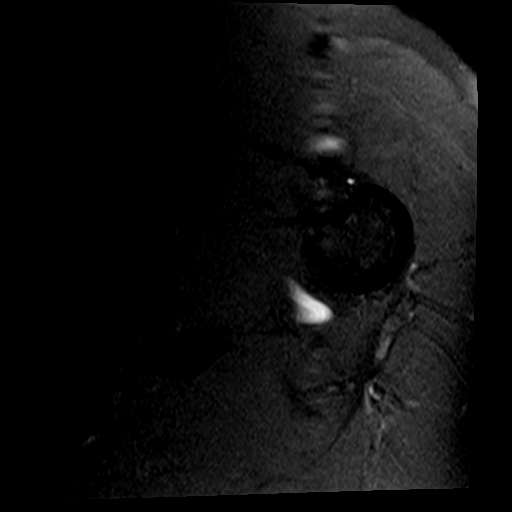
[im 6/21]
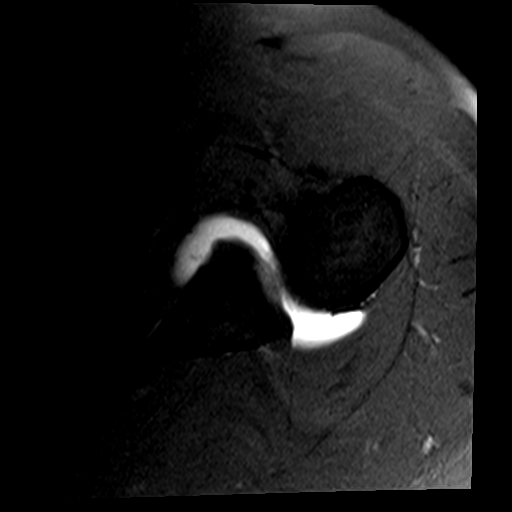
[im 9/21]
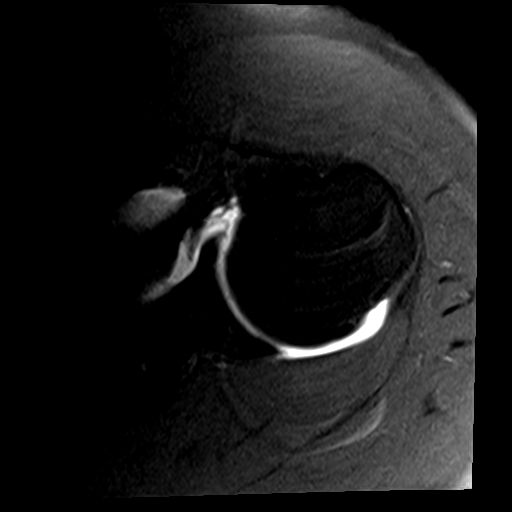
[im 12/21]
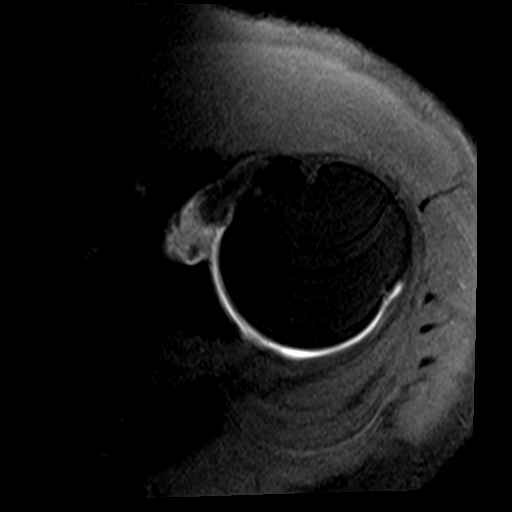
[im 15/21]
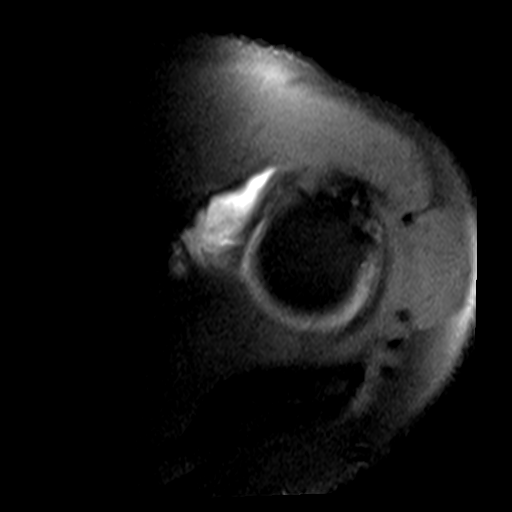
[im 18/21]
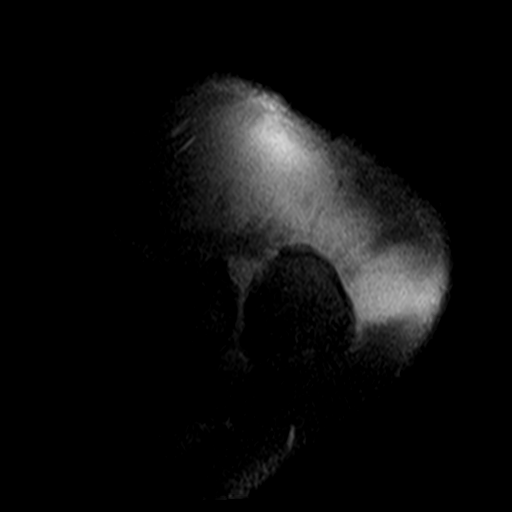
[im 21/21]
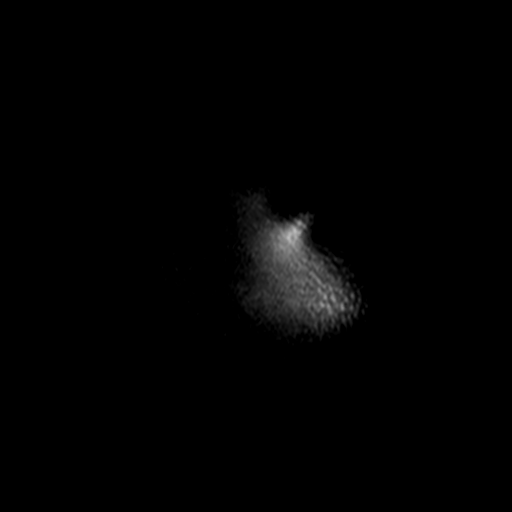

[Series 6: T2 fat-sat · oblique · 4.0mm · 0.55mm/px · 8 of 21 slices shown (1 of 2)]
[im 1/21]
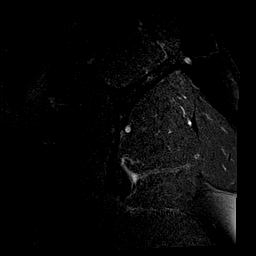
[im 3/21]
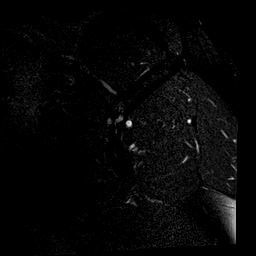
[im 6/21]
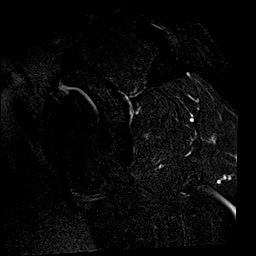
[im 9/21]
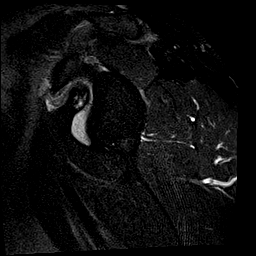
[im 12/21]
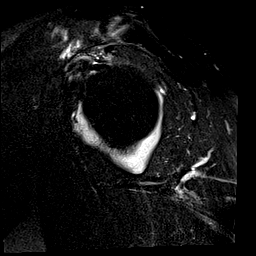
[im 15/21]
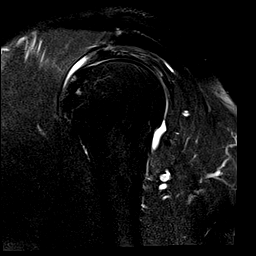
[im 18/21]
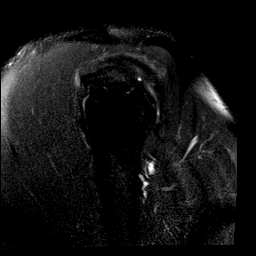
[im 21/21]
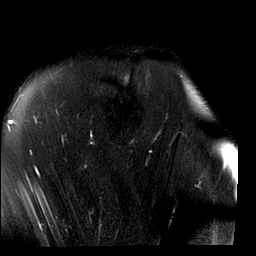

[Series 7: T1 fat-sat · oblique · 4.0mm · 0.55mm/px · 8 of 19 slices shown (2 of 3)]
[im 1/19]
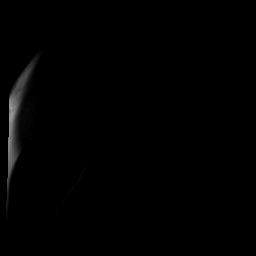
[im 3/19]
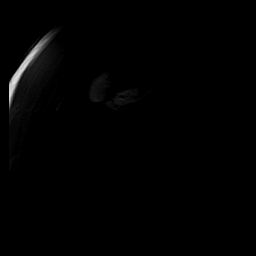
[im 6/19]
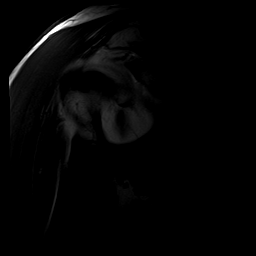
[im 8/19]
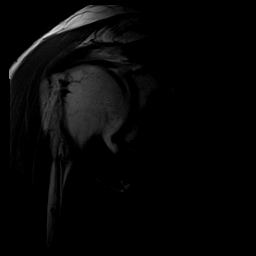
[im 11/19]
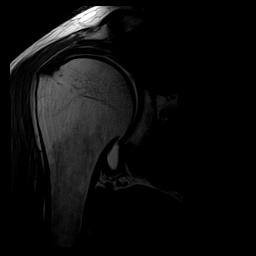
[im 13/19]
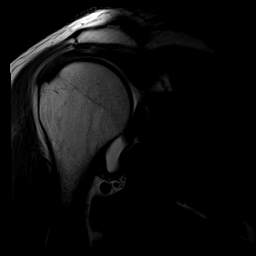
[im 16/19]
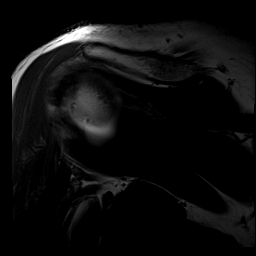
[im 19/19]
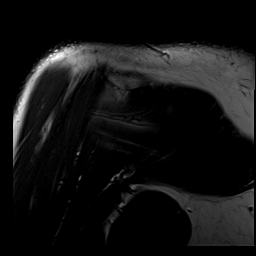

[Series 8: T1 fat-sat · oblique · 4.0mm · 0.55mm/px · 8 of 19 slices shown (3 of 3)]
[im 1/19]
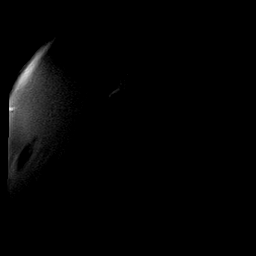
[im 3/19]
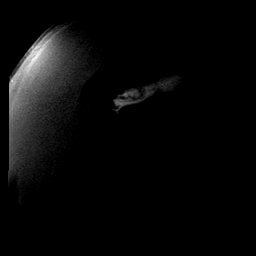
[im 6/19]
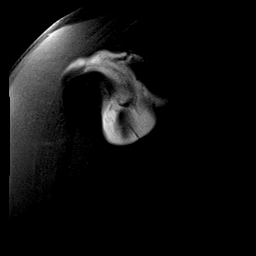
[im 8/19]
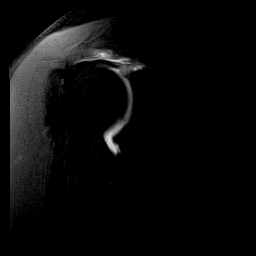
[im 11/19]
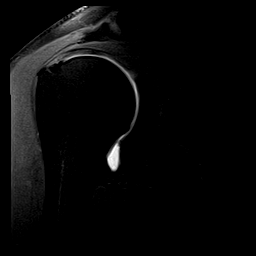
[im 13/19]
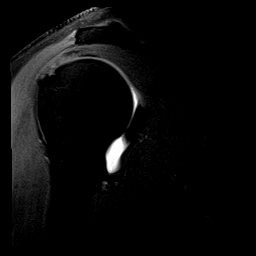
[im 16/19]
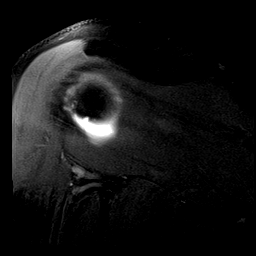
[im 19/19]
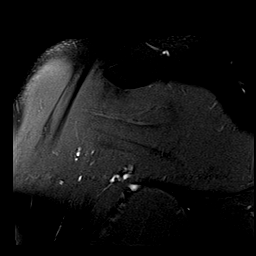

[Series 9: T2 fat-sat · oblique · 4.0mm · 0.55mm/px · 8 of 19 slices shown (2 of 2)]
[im 1/19]
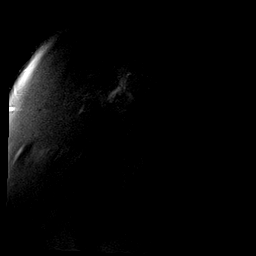
[im 3/19]
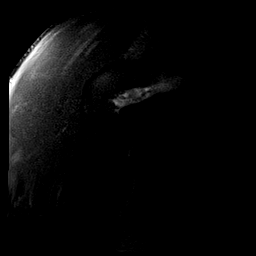
[im 6/19]
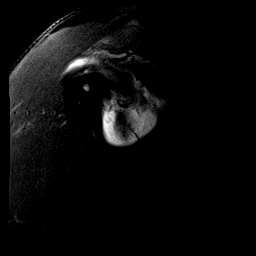
[im 8/19]
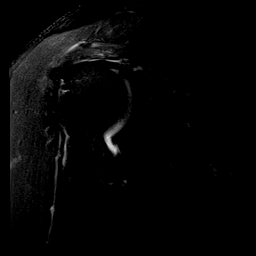
[im 11/19]
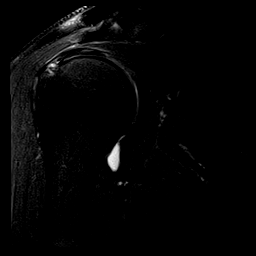
[im 13/19]
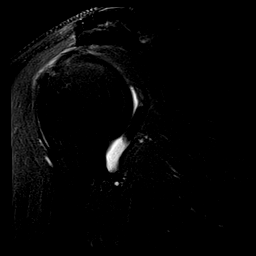
[im 16/19]
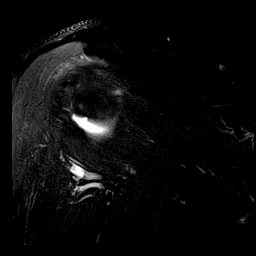
[im 19/19]
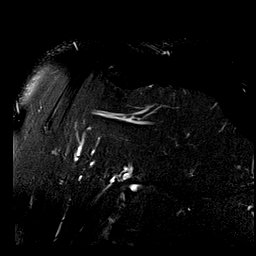

[40 of 40 positions shown; findings below may reference images not displayed]

FINDINGS: Rotator cuff: Supraspinatus tendinosis with partial thickness
articular sided fraying and tearing involving the anterior and mid
fibers at the tendon insertion, involving less than 50% of the
tendon depth. Findings have progressed compared to the prior study.
There is tendinosis of the infraspinatus and subscapularis tendons.
Teres minor intact. No full thickness rotator cuff tear.

Muscles: Normal in bulk and signal without atrophy or fatty
infiltration.

Biceps long head: Intact and appropriately located.

Acromioclavicular Joint: Mild AC joint arthropathy. No
subacromial-subdeltoid bursal fluid.

Glenohumeral Joint: No focal cartilage defect. Glenohumeral joint is
well distended with injected contrast. Intra-articular
heterogeneity, particularly within the subcoracoid joint recess
suggesting synovitis.

Labrum: Mild blunting of the anteroinferior labrum without discrete
tear.

Bones: Mild cystic changes at the rotator cuff footprint. No
fracture. No bone lesion.
IMPRESSION: 1. Rotator cuff tendinosis with low-grade partial-thickness
articular surface tearing of the distal supraspinatus tendon,
progressed compared to prior. No full-thickness rotator cuff tear.
2. Glenohumeral joint synovitis.
3. Mild labral degeneration without discrete tear.

## 2021-03-10 ENCOUNTER — Other Ambulatory Visit: Payer: Self-pay | Admitting: Family Medicine

## 2021-03-10 NOTE — Telephone Encounter (Signed)
Last filled 12/19/2020 Last OV 11/28/2020  Ok to fill?

## 2021-04-07 ENCOUNTER — Other Ambulatory Visit: Payer: Self-pay | Admitting: Family Medicine

## 2021-05-04 ENCOUNTER — Other Ambulatory Visit: Payer: Self-pay | Admitting: Family Medicine

## 2021-06-01 ENCOUNTER — Other Ambulatory Visit: Payer: Self-pay | Admitting: Family Medicine

## 2021-06-28 ENCOUNTER — Other Ambulatory Visit: Payer: Self-pay | Admitting: Family Medicine

## 2021-07-03 ENCOUNTER — Other Ambulatory Visit: Payer: Self-pay | Admitting: Family Medicine

## 2021-08-22 ENCOUNTER — Other Ambulatory Visit: Payer: Self-pay | Admitting: Family Medicine

## 2021-09-05 ENCOUNTER — Ambulatory Visit (INDEPENDENT_AMBULATORY_CARE_PROVIDER_SITE_OTHER): Payer: Managed Care, Other (non HMO) | Admitting: Family Medicine

## 2021-09-05 ENCOUNTER — Encounter: Payer: Self-pay | Admitting: Family Medicine

## 2021-09-05 VITALS — BP 132/88 | HR 85 | Temp 97.9°F | Ht 75.59 in | Wt 279.6 lb

## 2021-09-05 DIAGNOSIS — Z0000 Encounter for general adult medical examination without abnormal findings: Secondary | ICD-10-CM

## 2021-09-05 DIAGNOSIS — Z23 Encounter for immunization: Secondary | ICD-10-CM

## 2021-09-05 LAB — HEPATIC FUNCTION PANEL
ALT: 33 U/L (ref 0–53)
AST: 22 U/L (ref 0–37)
Albumin: 4.7 g/dL (ref 3.5–5.2)
Alkaline Phosphatase: 61 U/L (ref 39–117)
Bilirubin, Direct: 0.1 mg/dL (ref 0.0–0.3)
Total Bilirubin: 0.4 mg/dL (ref 0.2–1.2)
Total Protein: 7.1 g/dL (ref 6.0–8.3)

## 2021-09-05 LAB — CBC WITH DIFFERENTIAL/PLATELET
Basophils Absolute: 0.1 10*3/uL (ref 0.0–0.1)
Basophils Relative: 0.9 % (ref 0.0–3.0)
Eosinophils Absolute: 0.2 10*3/uL (ref 0.0–0.7)
Eosinophils Relative: 2.3 % (ref 0.0–5.0)
HCT: 41.8 % (ref 39.0–52.0)
Hemoglobin: 13.8 g/dL (ref 13.0–17.0)
Lymphocytes Relative: 24.3 % (ref 12.0–46.0)
Lymphs Abs: 1.6 10*3/uL (ref 0.7–4.0)
MCHC: 33 g/dL (ref 30.0–36.0)
MCV: 93.4 fl (ref 78.0–100.0)
Monocytes Absolute: 0.5 10*3/uL (ref 0.1–1.0)
Monocytes Relative: 8.4 % (ref 3.0–12.0)
Neutro Abs: 4.1 10*3/uL (ref 1.4–7.7)
Neutrophils Relative %: 64.1 % (ref 43.0–77.0)
Platelets: 335 10*3/uL (ref 150.0–400.0)
RBC: 4.47 Mil/uL (ref 4.22–5.81)
RDW: 13.9 % (ref 11.5–15.5)
WBC: 6.4 10*3/uL (ref 4.0–10.5)

## 2021-09-05 LAB — LIPID PANEL
Cholesterol: 185 mg/dL (ref 0–200)
HDL: 51 mg/dL (ref >39.00)
LDL Cholesterol: 114 mg/dL — ABNORMAL HIGH (ref 0–99)
NonHDL: 133.7
Total CHOL/HDL Ratio: 4
Triglycerides: 100 mg/dL (ref 0.0–149.0)
VLDL: 20 mg/dL (ref 0.0–40.0)

## 2021-09-05 LAB — BASIC METABOLIC PANEL
BUN: 20 mg/dL (ref 6–23)
CO2: 28 mEq/L (ref 19–32)
Calcium: 9.4 mg/dL (ref 8.4–10.5)
Chloride: 100 mEq/L (ref 96–112)
Creatinine, Ser: 1.45 mg/dL (ref 0.40–1.50)
GFR: 57.19 mL/min — ABNORMAL LOW (ref >60.00)
Glucose, Bld: 92 mg/dL (ref 70–99)
Potassium: 4.3 mEq/L (ref 3.5–5.1)
Sodium: 138 mEq/L (ref 135–145)

## 2021-09-05 LAB — HEMOGLOBIN A1C: Hgb A1c MFr Bld: 6.1 % (ref 4.6–6.5)

## 2021-09-05 LAB — TSH: TSH: 0.55 u[IU]/mL (ref 0.35–5.50)

## 2021-09-05 LAB — PSA: PSA: 0.51 ng/mL (ref 0.10–4.00)

## 2021-09-05 NOTE — Addendum Note (Signed)
Addended by: Nilda Riggs on: 09/05/2021 10:06 AM   Modules accepted: Orders
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
## 2021-10-08 ENCOUNTER — Encounter (HOSPITAL_COMMUNITY): Payer: Self-pay | Admitting: *Deleted

## 2021-10-08 ENCOUNTER — Other Ambulatory Visit: Payer: Self-pay

## 2021-10-08 ENCOUNTER — Ambulatory Visit (HOSPITAL_COMMUNITY)
Admission: EM | Admit: 2021-10-08 | Discharge: 2021-10-08 | Disposition: A | Discharge status: Home / Self Care | Payer: Managed Care, Other (non HMO) | Attending: Physician Assistant | Admitting: Physician Assistant

## 2021-10-08 DIAGNOSIS — R051 Acute cough: Secondary | ICD-10-CM

## 2021-10-08 DIAGNOSIS — J069 Acute upper respiratory infection, unspecified: Secondary | ICD-10-CM

## 2021-10-08 DIAGNOSIS — J40 Bronchitis, not specified as acute or chronic: Secondary | ICD-10-CM

## 2021-10-08 MED ORDER — ALBUTEROL SULFATE HFA 108 (90 BASE) MCG/ACT IN AERS: 2.0000 | INHALATION_SPRAY | RESPIRATORY_TRACT | 3 refills | Status: AC | PRN

## 2021-10-08 MED ORDER — DOXYCYCLINE HYCLATE 100 MG PO CAPS: 100.0000 mg | ORAL_CAPSULE | Freq: Two times a day (BID) | ORAL | 0 refills | Status: DC

## 2021-10-08 MED ORDER — HYDROCODONE BIT-HOMATROP MBR 5-1.5 MG/5ML PO SOLN
5.0000 mL | Freq: Four times a day (QID) | ORAL | 0 refills | Status: DC | PRN
Start: 2021-10-08 — End: 2022-03-07

## 2021-10-08 NOTE — ED Triage Notes (Signed)
PT reports he has had a cough for three weeks. Pt also has a HA from coughing.
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
## 2021-10-08 NOTE — Discharge Instructions (Signed)
Advised to continue using the steroid inhaler on a regular basis to help reduce respiratory inflammation. Advised to use the albuterol inhaler, 2 puffs every 6 hours on a regular basis to help decrease cough, clear congestion, and prevent wheezing. Advised to use the Hycodan cough syrup, 1 to 2 teaspoons every 6-8 hours as needed for cough, be careful this medication may make you drowsy. Advised take the doxycycline 100 mg twice daily until completed as this will help treat respiratory infection. Advised to follow-up PCP or return to urgent care if symptoms fail to improve.

## 2021-10-19 ENCOUNTER — Other Ambulatory Visit: Payer: Self-pay | Admitting: Family Medicine

## 2021-11-17 ENCOUNTER — Other Ambulatory Visit: Payer: Self-pay | Admitting: Family Medicine

## 2021-11-17 NOTE — Telephone Encounter (Signed)
Last OV CPE-09/05/21 Last refill-08/22/21---120 tabs, 2 refills  No future OV scheduled.

## 2021-11-22 ENCOUNTER — Encounter: Payer: Self-pay | Admitting: Podiatry

## 2021-11-22 ENCOUNTER — Ambulatory Visit (INDEPENDENT_AMBULATORY_CARE_PROVIDER_SITE_OTHER): Payer: Managed Care, Other (non HMO)

## 2021-11-22 ENCOUNTER — Ambulatory Visit: Payer: Managed Care, Other (non HMO) | Admitting: Podiatry

## 2021-11-22 DIAGNOSIS — M722 Plantar fascial fibromatosis: Secondary | ICD-10-CM

## 2021-11-22 MED ORDER — TRIAMCINOLONE ACETONIDE 10 MG/ML IJ SUSP
10.0000 mg | Freq: Once | INTRAMUSCULAR | Status: AC
  Administered 2021-11-22: 10 mg

## 2021-11-22 NOTE — Progress Notes (Signed)
Subjective:   Patient ID: [name], male   DOB: 46 y.o.   MRN: [id number]   HPI Patient presents stating he has had 3 months of pain in his left plantar heel and states its been very sore making it hard to walk.  He has not had radiation he is trying to be more active and does not smoke likes to be active   Review of Systems  All other systems reviewed and are negative.       Objective:  Physical Exam Vitals and nursing note reviewed.  Constitutional:      Appearance: He is well-developed.  Pulmonary:     Effort: Pulmonary effort is normal.  Musculoskeletal:        General: Normal range of motion.  Skin:    General: Skin is warm.  Neurological:     Mental Status: He is alert.     Neurovascular status intact muscle strength was found to be adequate range of motion adequate with patient found to have exquisite discomfort in the medial band of the plantar fascia insertion calcaneus moderate depression of the arch with fluid buildup around the area.  Patient is found to have good digital perfusion well-oriented x3     Assessment:  2 plantar fasciitis left inflammation fluid in the medial band with moderate flatfoot     Plan:  H&P reviewed condition sterile prep and injected the plantar fascia at insertion 3 mg Kenalog 5 mg Xylocaine applied fascial brace to lift up the arch reappoint to recheck  X-rays indicate moderate depression of the arch small spur no indication stress fracture arthritis

## 2021-11-22 NOTE — Patient Instructions (Signed)

## 2021-12-06 ENCOUNTER — Encounter: Payer: Self-pay | Admitting: Podiatry

## 2021-12-06 ENCOUNTER — Ambulatory Visit (INDEPENDENT_AMBULATORY_CARE_PROVIDER_SITE_OTHER): Payer: Managed Care, Other (non HMO) | Admitting: Podiatry

## 2021-12-06 DIAGNOSIS — M722 Plantar fascial fibromatosis: Secondary | ICD-10-CM

## 2021-12-06 MED ORDER — TRIAMCINOLONE ACETONIDE 10 MG/ML IJ SUSP
10.0000 mg | Freq: Once | INTRAMUSCULAR | Status: AC
  Administered 2021-12-06: 10 mg

## 2021-12-06 MED ORDER — DICLOFENAC SODIUM 75 MG PO TBEC: 75.0000 mg | DELAYED_RELEASE_TABLET | Freq: Two times a day (BID) | ORAL | 2 refills | Status: DC

## 2021-12-06 NOTE — Progress Notes (Signed)
Subjective:   Patient ID: [name], male   DOB: 46 y.o.   MRN: [id number]   HPI Patient presents stating he has severe heel pain still noted left and it did not really seem to respond and he does have to stand all day with moderate obesity is complicating factor   ROS      Objective:  Physical Exam  Neurovascular status intact with patient found to have exquisite discomfort plantar aspect left heel at the insertional point of the tendon into the calcaneus     Assessment:  Acute plantar fasciitis left that did not respond to previous conservative treatment     Plan:  H&P reviewed condition and at this point organ to completely immobilize with air fracture walker dispensed fitted properly to his lower leg with all instructions given and I did do sterile prep and injected the fascia 3 mg Kenalog 5 mg Xylocaine placed on diclofenac reappoint to recheck again in 4 weeks or earlier if necessary

## 2021-12-19 ENCOUNTER — Other Ambulatory Visit: Payer: Self-pay | Admitting: Family Medicine

## 2022-01-03 ENCOUNTER — Encounter: Payer: Self-pay | Admitting: Podiatry

## 2022-01-03 ENCOUNTER — Ambulatory Visit: Payer: Managed Care, Other (non HMO) | Admitting: Podiatry

## 2022-01-03 DIAGNOSIS — M722 Plantar fascial fibromatosis: Secondary | ICD-10-CM

## 2022-01-03 DIAGNOSIS — M216X1 Other acquired deformities of right foot: Secondary | ICD-10-CM | POA: Diagnosis not present

## 2022-01-03 NOTE — Progress Notes (Signed)
Subjective:   Patient ID: [name], male   DOB: 46 y.o.   MRN: [id number]   HPI Patient states his heel is improved some but it still sore and it has not really been tested outside the boot   ROS      Objective:  Physical Exam  Ocular status intact with patient's left plantar heel still tender when pressed with improvement but pain still upon deep palpation noted with moderate depression of the arch noted     Assessment:  Chronic pronation type activity with flatfoot deformity leading to increased stress on the plantar fascia and pain     Plan:  H&P reviewed condition and at this point casted for functional orthotic devices.  Educated him on orthotics and hopefully this will solve his problem patient encouraged to call questions concerns which may arise and may require further injection if symptoms persist.  Gradually reduce the boot at this time

## 2022-02-07 ENCOUNTER — Other Ambulatory Visit: Payer: Self-pay | Admitting: Family Medicine

## 2022-02-08 ENCOUNTER — Other Ambulatory Visit: Payer: Self-pay | Admitting: Family Medicine

## 2022-02-14 ENCOUNTER — Telehealth: Payer: Self-pay | Admitting: Podiatry

## 2022-02-14 NOTE — Telephone Encounter (Signed)
Lmom to call back to set up appt to pick up orthotics .    Balance  $490.00

## 2022-03-07 ENCOUNTER — Encounter: Payer: Self-pay | Admitting: Family Medicine

## 2022-03-07 ENCOUNTER — Ambulatory Visit: Payer: Managed Care, Other (non HMO) | Admitting: Family Medicine

## 2022-03-07 VITALS — BP 136/86 | HR 92 | Temp 98.0°F | Ht 75.59 in | Wt 288.3 lb

## 2022-03-07 DIAGNOSIS — J4540 Moderate persistent asthma, uncomplicated: Secondary | ICD-10-CM | POA: Diagnosis not present

## 2022-03-07 DIAGNOSIS — J309 Allergic rhinitis, unspecified: Secondary | ICD-10-CM

## 2022-03-07 MED ORDER — PREDNISONE 20 MG PO TABS: ORAL_TABLET | ORAL | 0 refills | Status: DC

## 2022-03-07 NOTE — Patient Instructions (Signed)
I will be setting up pulmonary referral.
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
## 2022-03-12 NOTE — Telephone Encounter (Signed)
Lmom to call back to set up appt to pick up orthotics .

## 2022-03-15 ENCOUNTER — Other Ambulatory Visit: Payer: Self-pay | Admitting: Internal Medicine

## 2022-04-06 ENCOUNTER — Ambulatory Visit: Payer: Managed Care, Other (non HMO) | Admitting: Podiatry

## 2022-05-11 ENCOUNTER — Other Ambulatory Visit: Payer: Self-pay | Admitting: Family Medicine

## 2022-05-16 ENCOUNTER — Other Ambulatory Visit: Payer: Self-pay | Admitting: Podiatry

## 2022-05-17 ENCOUNTER — Other Ambulatory Visit: Payer: Self-pay | Admitting: Family Medicine

## 2022-05-28 ENCOUNTER — Telehealth: Payer: Self-pay | Admitting: Urology

## 2022-05-28 ENCOUNTER — Encounter: Payer: Self-pay | Admitting: Podiatry

## 2022-05-28 ENCOUNTER — Ambulatory Visit (INDEPENDENT_AMBULATORY_CARE_PROVIDER_SITE_OTHER): Payer: Managed Care, Other (non HMO)

## 2022-05-28 ENCOUNTER — Ambulatory Visit (INDEPENDENT_AMBULATORY_CARE_PROVIDER_SITE_OTHER): Payer: Managed Care, Other (non HMO) | Admitting: Podiatry

## 2022-05-28 DIAGNOSIS — R52 Pain, unspecified: Secondary | ICD-10-CM

## 2022-05-28 NOTE — Telephone Encounter (Signed)
DOS - 06/19/22  EPF LEFT --- 16109  [employer] EFFECTIVE DATE - 10/22/20  PER [employer]'S AUTOMATED SYSTEM FOR CPT CODE 60454 NO PRIOR AUTH IS REQUIRED.   REF # [id number]

## 2022-05-28 NOTE — Progress Notes (Signed)
Subjective:   Patient ID: [name], male   DOB: 47 y.o.   MRN: [id number]   HPI Patient states his heel is very sore did not respond well he has been trying to wear the boot but he is not making any improvement.  Patient's not been seen for approximately 5 months and is just been struggling with condition   ROS      Objective:  Physical Exam  Neurovascular status intact exquisite discomfort medial fascial band left at the insertional point tendon into the calcaneus     Assessment:  Acute Planter fasciitis left inflammation fluid around the medial band not responding conservatively had been present for close to a year     Plan:  H&P reviewed condition discussed at great length patient is opted for surgery due to the intense discomfort and failure to respond to immobilization injections and I allowed him to read consent form going over alternative treatments complications.  Patient understands all complications outlined in the consent form is willing to accept risk wants surgery signed consent form after extensive review and understands recovery can take approximately 6 months with pain in the arch or other parts of the foot which can occur for a period of time postoperatively.  X-ray today indicated no signs of a stress fracture mild increase in spur size left

## 2022-06-04 ENCOUNTER — Other Ambulatory Visit: Payer: Self-pay | Admitting: Podiatry

## 2022-06-04 ENCOUNTER — Telehealth: Payer: Self-pay

## 2022-06-04 ENCOUNTER — Telehealth: Payer: Self-pay | Admitting: Podiatry

## 2022-06-04 MED ORDER — DICLOFENAC SODIUM 75 MG PO TBEC: 75.0000 mg | DELAYED_RELEASE_TABLET | Freq: Two times a day (BID) | ORAL | 2 refills | Status: DC

## 2022-06-04 NOTE — Telephone Encounter (Signed)
done

## 2022-06-04 NOTE — Telephone Encounter (Signed)
Patient request a refill on the Voltaren. Surgery scheduled for 07/24/2022.

## 2022-06-04 NOTE — Telephone Encounter (Signed)
[name]'s wife called to reschedule his surgery from 06/19/2022 to 07/24/2022 with Dr. Charlsie Merles. They have a $3000.00 deductible to pay. Plan will restart on 07/23/2022. I have him rescheduled to 07/24/2022. Notified Dr. Charlsie Merles and Aram Beecham at [facility].

## 2022-06-24 ENCOUNTER — Other Ambulatory Visit: Payer: Self-pay | Admitting: Family Medicine

## 2022-06-25 ENCOUNTER — Encounter: Payer: Managed Care, Other (non HMO) | Admitting: Podiatry

## 2022-07-09 ENCOUNTER — Encounter: Payer: Managed Care, Other (non HMO) | Admitting: Podiatry

## 2022-07-12 ENCOUNTER — Telehealth: Payer: Self-pay | Admitting: Podiatry

## 2022-07-12 NOTE — Telephone Encounter (Signed)
Lvm to sched appt to pick up orthos.  Bal 490.00

## 2022-07-23 MED ORDER — HYDROCODONE-ACETAMINOPHEN 10-325 MG PO TABS: 1.0000 | ORAL_TABLET | Freq: Three times a day (TID) | ORAL | 0 refills | Status: AC | PRN

## 2022-07-23 NOTE — Addendum Note (Signed)
Addended by: Lenn Sink on: 07/23/2022 03:18 PM   Modules accepted: Orders

## 2022-07-24 ENCOUNTER — Encounter: Payer: Self-pay | Admitting: Podiatry

## 2022-07-24 DIAGNOSIS — M722 Plantar fascial fibromatosis: Secondary | ICD-10-CM | POA: Diagnosis not present

## 2022-07-27 ENCOUNTER — Other Ambulatory Visit: Payer: Self-pay | Admitting: Family Medicine

## 2022-07-29 ENCOUNTER — Other Ambulatory Visit: Payer: Self-pay | Admitting: Family Medicine

## 2022-07-30 ENCOUNTER — Encounter: Payer: Self-pay | Admitting: Podiatry

## 2022-07-30 ENCOUNTER — Ambulatory Visit (INDEPENDENT_AMBULATORY_CARE_PROVIDER_SITE_OTHER): Payer: Managed Care, Other (non HMO) | Admitting: Podiatry

## 2022-07-30 DIAGNOSIS — M722 Plantar fascial fibromatosis: Secondary | ICD-10-CM

## 2022-07-31 NOTE — Progress Notes (Signed)
Subjective:   Patient ID: [name], male   DOB: 47 y.o.   MRN: [id number]   HPI Patient presents stating doing well with surgery minimal discomfort   ROS      Objective:  Physical Exam  Neurovascular status intact negative Denna Haggard' sign noted wound edges well coapted stitches intact     Assessment:  Acute fascial inflammation left doing well with surgical intervention     Plan:  H&P sterile dressing reapplied continue boot usage dispensed surgical shoe that he gradually may wear reappoint 2 weeks suture removal earlier if needed

## 2022-08-13 ENCOUNTER — Encounter: Payer: Managed Care, Other (non HMO) | Admitting: Podiatry

## 2022-08-15 ENCOUNTER — Ambulatory Visit (INDEPENDENT_AMBULATORY_CARE_PROVIDER_SITE_OTHER): Payer: Managed Care, Other (non HMO) | Admitting: Podiatry

## 2022-08-15 ENCOUNTER — Encounter: Payer: Self-pay | Admitting: Podiatry

## 2022-08-15 DIAGNOSIS — Z9889 Other specified postprocedural states: Secondary | ICD-10-CM

## 2022-08-15 NOTE — Progress Notes (Signed)
  Subjective:  Patient ID: [name], male    DOB: 02-27-1975,  MRN: [id number]  No chief complaint on file.   DOS: 07/24/22 Procedure: Left endoscopic plantar fasciotomy.   47 y.o. male returns for POV#2. Relates doing well with minimal pain.   Review of Systems: Negative except as noted in the HPI. Denies N/V/F/Ch.  Past Medical History:  Diagnosis Date   Allergy    Anxiety    Asthma    mod persistent   Chronic headaches    GERD (gastroesophageal reflux disease)    Hypertension     Current Outpatient Medications:    ADVAIR DISKUS 250-50 MCG/DOSE AEPB, INHALE 1 PUFF BY MOUTH TWICE A DAY, Disp: 60 each, Rfl: 11   albuterol (PROAIR HFA) 108 (90 Base) MCG/ACT inhaler, Inhale 2 puffs into the lungs every 4 (four) hours as needed for wheezing or shortness of breath., Disp: 1 each, Rfl: 3   amLODipine (NORVASC) 5 MG tablet, TAKE 1 TABLET (5 MG TOTAL) BY MOUTH DAILY., Disp: 90 tablet, Rfl: 0   diclofenac (VOLTAREN) 75 MG EC tablet, Take 1 tablet (75 mg total) by mouth 2 (two) times daily., Disp: 50 tablet, Rfl: 2   diclofenac (VOLTAREN) 75 MG EC tablet, Take 1 tablet (75 mg total) by mouth 2 (two) times daily., Disp: 50 tablet, Rfl: 2   LORazepam (ATIVAN) 0.5 MG tablet, Take 1 to 2 tablets 1 hour prior to flying, Disp: 10 tablet, Rfl: 0   losartan-hydrochlorothiazide (HYZAAR) 50-12.5 MG tablet, TAKE 1 TABLET BY MOUTH EVERY DAY, Disp: 90 tablet, Rfl: 0   montelukast (SINGULAIR) 10 MG tablet, TAKE 1 TABLET BY MOUTH EVERYDAY AT BEDTIME, Disp: 90 tablet, Rfl: 0   Multiple Vitamins-Minerals (PRESERVISION AREDS 2) CAPS, Take 1 capsule by mouth 2 (two) times daily., Disp: , Rfl:    omeprazole (PRILOSEC) 40 MG capsule, TAKE 1 CAPSULE BY MOUTH EVERY DAY, Disp: 90 capsule, Rfl: 2   predniSONE (DELTASONE) 20 MG tablet, Taper as follows: 3-3-3-2-2-2-1-1-1, Disp: 18 tablet, Rfl: 0   sertraline (ZOLOFT) 50 MG tablet, TAKE 1 TABLET BY MOUTH EVERY DAY, Disp: 90 tablet, Rfl: 0   traMADol (ULTRAM)  50 MG tablet, TAKE 1 TABLET BY MOUTH EVERY 6 HOURS AS NEEDED FOR PAIN, Disp: 120 tablet, Rfl: 2  Social History   Tobacco Use  Smoking Status Never  Smokeless Tobacco Never    No Known Allergies Objective:  There were no vitals filed for this visit. There is no height or weight on file to calculate BMI. Constitutional Well developed. Well nourished.  Vascular Foot warm and well perfused. Capillary refill normal to all digits.   Neurologic Normal speech. Oriented to person, place, and time. Epicritic sensation to light touch grossly present bilaterally.  Dermatologic Skin healing well without signs of infection. Skin edges well coapted without signs of infection.  Orthopedic: Tenderness to palpation noted about the surgical site.   Assessment:   1. Post-operative state    Plan:  Patient was evaluated and treated and all questions answered.  S/p foot surgery left -Progressing as expected post-operatively. -WB Status: WBAT in regular shoes may begin to transition -Sutures: removed today without incident. -Medications: n/a -Foot redressed.   Follow-up in 3 weeks for recheck with Dr. Charlsie Merles.   Return in about 3 weeks (around 09/05/2022) for post op.

## 2022-09-05 ENCOUNTER — Encounter: Payer: Managed Care, Other (non HMO) | Admitting: Podiatry

## 2022-10-26 ENCOUNTER — Other Ambulatory Visit: Payer: Self-pay | Admitting: Family Medicine

## 2022-11-05 ENCOUNTER — Other Ambulatory Visit: Payer: Self-pay | Admitting: Family Medicine

## 2022-11-07 ENCOUNTER — Encounter: Payer: Self-pay | Admitting: Family Medicine

## 2022-11-07 ENCOUNTER — Ambulatory Visit: Payer: Managed Care, Other (non HMO) | Admitting: Family Medicine

## 2022-11-07 VITALS — BP 120/80 | HR 95 | Temp 98.4°F | Ht 75.59 in | Wt 285.3 lb

## 2022-11-07 DIAGNOSIS — Z23 Encounter for immunization: Secondary | ICD-10-CM

## 2022-11-07 DIAGNOSIS — R5383 Other fatigue: Secondary | ICD-10-CM | POA: Diagnosis not present

## 2022-11-07 DIAGNOSIS — R7989 Other specified abnormal findings of blood chemistry: Secondary | ICD-10-CM

## 2022-11-07 DIAGNOSIS — Z0000 Encounter for general adult medical examination without abnormal findings: Secondary | ICD-10-CM

## 2022-11-07 DIAGNOSIS — R7303 Prediabetes: Secondary | ICD-10-CM

## 2022-11-07 LAB — BASIC METABOLIC PANEL
BUN: 11 mg/dL (ref 6–23)
CO2: 28 meq/L (ref 19–32)
Calcium: 9.6 mg/dL (ref 8.4–10.5)
Chloride: 102 meq/L (ref 96–112)
Creatinine, Ser: 1.36 mg/dL (ref 0.40–1.50)
GFR: 61.25 mL/min (ref >60.00)
Glucose, Bld: 96 mg/dL (ref 70–99)
Potassium: 4.4 meq/L (ref 3.5–5.1)
Sodium: 138 meq/L (ref 135–145)

## 2022-11-07 LAB — CBC WITH DIFFERENTIAL/PLATELET
Basophils Absolute: 0.1 10*3/uL (ref 0.0–0.1)
Basophils Relative: 1.3 % (ref 0.0–3.0)
Eosinophils Absolute: 0.2 10*3/uL (ref 0.0–0.7)
Eosinophils Relative: 4 % (ref 0.0–5.0)
HCT: 45.4 % (ref 39.0–52.0)
Hemoglobin: 14.8 g/dL (ref 13.0–17.0)
Lymphocytes Relative: 20.1 % (ref 12.0–46.0)
Lymphs Abs: 1.2 10*3/uL (ref 0.7–4.0)
MCHC: 32.5 g/dL (ref 30.0–36.0)
MCV: 96.2 fL (ref 78.0–100.0)
Monocytes Absolute: 0.7 10*3/uL (ref 0.1–1.0)
Monocytes Relative: 11.8 % (ref 3.0–12.0)
Neutro Abs: 3.7 10*3/uL (ref 1.4–7.7)
Neutrophils Relative %: 62.8 % (ref 43.0–77.0)
Platelets: 332 10*3/uL (ref 150.0–400.0)
RBC: 4.72 Mil/uL (ref 4.22–5.81)
RDW: 15.2 % (ref 11.5–15.5)
WBC: 5.8 10*3/uL (ref 4.0–10.5)

## 2022-11-07 LAB — HEPATIC FUNCTION PANEL
ALT: 31 U/L (ref 0–53)
AST: 22 U/L (ref 0–37)
Albumin: 4.6 g/dL (ref 3.5–5.2)
Alkaline Phosphatase: 50 U/L (ref 39–117)
Bilirubin, Direct: 0.1 mg/dL (ref 0.0–0.3)
Total Bilirubin: 0.6 mg/dL (ref 0.2–1.2)
Total Protein: 7.1 g/dL (ref 6.0–8.3)

## 2022-11-07 LAB — LIPID PANEL
Cholesterol: 179 mg/dL (ref 0–200)
HDL: 45.6 mg/dL (ref >39.00)
LDL Cholesterol: 114 mg/dL — ABNORMAL HIGH (ref 0–99)
NonHDL: 133.32
Total CHOL/HDL Ratio: 4
Triglycerides: 95 mg/dL (ref 0.0–149.0)
VLDL: 19 mg/dL (ref 0.0–40.0)

## 2022-11-07 LAB — TESTOSTERONE: Testosterone: 1225 ng/dL — ABNORMAL HIGH (ref 300.00–890.00)

## 2022-11-07 LAB — PSA: PSA: 0.76 ng/mL (ref 0.10–4.00)

## 2022-11-07 LAB — HEMOGLOBIN A1C: Hgb A1c MFr Bld: 5.5 % (ref 4.6–6.5)
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
## 2022-11-07 NOTE — Addendum Note (Signed)
Addended by: Christy Sartorius on: 11/07/2022 09:41 AM   Modules accepted: Orders

## 2022-11-09 ENCOUNTER — Telehealth: Payer: Self-pay | Admitting: Family Medicine

## 2022-11-09 NOTE — Telephone Encounter (Signed)
Pt called, returning CMA's call. CMA was with a patient. Pt asked that CMA call back at his earliest convenience. 

## 2022-11-09 NOTE — Telephone Encounter (Signed)
Please see result note 

## 2022-11-12 NOTE — Addendum Note (Signed)
Addended by: Christy Sartorius on: 11/12/2022 09:55 AM   Modules accepted: Orders

## 2022-11-24 ENCOUNTER — Emergency Department (HOSPITAL_COMMUNITY)
Admission: EM | Admit: 2022-11-24 | Discharge status: Absent without leave | Payer: Managed Care, Other (non HMO) | Source: Home / Self Care

## 2022-12-24 ENCOUNTER — Other Ambulatory Visit: Payer: Self-pay | Admitting: Family Medicine

## 2023-01-24 ENCOUNTER — Other Ambulatory Visit: Payer: Self-pay | Admitting: Family Medicine

## 2023-01-25 ENCOUNTER — Other Ambulatory Visit: Payer: Self-pay | Admitting: Family Medicine

## 2023-01-29 NOTE — Telephone Encounter (Signed)
 Pt has an appt. In 3 days

## 2023-02-01 ENCOUNTER — Ambulatory Visit: Payer: Managed Care, Other (non HMO) | Admitting: Family Medicine

## 2023-02-01 VITALS — BP 146/80 | HR 99 | Temp 98.3°F | Wt 260.6 lb

## 2023-02-01 DIAGNOSIS — J4541 Moderate persistent asthma with (acute) exacerbation: Secondary | ICD-10-CM | POA: Diagnosis not present

## 2023-02-01 MED ORDER — PREDNISONE 20 MG PO TABS: ORAL_TABLET | ORAL | 0 refills | Status: DC

## 2023-02-01 MED ORDER — BUDESONIDE-FORMOTEROL FUMARATE 160-4.5 MCG/ACT IN AERO
2.0000 | INHALATION_SPRAY | Freq: Two times a day (BID) | RESPIRATORY_TRACT | 3 refills | Status: AC
Start: 2023-02-01 — End: ?
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
## 2023-02-28 ENCOUNTER — Other Ambulatory Visit: Payer: Self-pay | Admitting: Family Medicine

## 2023-03-25 ENCOUNTER — Other Ambulatory Visit: Payer: Self-pay | Admitting: Family Medicine

## 2023-03-31 ENCOUNTER — Other Ambulatory Visit: Payer: Self-pay | Admitting: Family Medicine

## 2023-04-28 ENCOUNTER — Other Ambulatory Visit: Payer: Self-pay | Admitting: Family Medicine

## 2023-05-29 ENCOUNTER — Other Ambulatory Visit: Payer: Self-pay | Admitting: Family Medicine

## 2023-07-28 ENCOUNTER — Other Ambulatory Visit: Payer: Self-pay | Admitting: Family Medicine

## 2023-08-19 ENCOUNTER — Ambulatory Visit (INDEPENDENT_AMBULATORY_CARE_PROVIDER_SITE_OTHER): Admitting: Family Medicine

## 2023-08-19 ENCOUNTER — Encounter: Payer: Self-pay | Admitting: Family Medicine

## 2023-08-19 ENCOUNTER — Ambulatory Visit: Payer: Self-pay

## 2023-08-19 VITALS — BP 118/70 | HR 64 | Temp 97.9°F | Wt 266.1 lb

## 2023-08-19 DIAGNOSIS — J4540 Moderate persistent asthma, uncomplicated: Secondary | ICD-10-CM

## 2023-08-19 DIAGNOSIS — J4541 Moderate persistent asthma with (acute) exacerbation: Secondary | ICD-10-CM

## 2023-08-19 DIAGNOSIS — I10 Essential (primary) hypertension: Secondary | ICD-10-CM | POA: Diagnosis not present

## 2023-08-19 DIAGNOSIS — R062 Wheezing: Secondary | ICD-10-CM

## 2023-08-19 MED ORDER — PREDNISONE 20 MG PO TABS: ORAL_TABLET | ORAL | 0 refills | Status: DC

## 2023-08-19 NOTE — Patient Instructions (Signed)
 Set up follow up and bring in your BP cuff at follow up to compare with ours.

## 2023-08-19 NOTE — Telephone Encounter (Signed)
 FYI Only or Action Required?: FYI only for provider.  Patient was last seen in primary care on 02/01/2023 by Micheal Wolm ORN, MD.  Called Nurse Triage reporting Shortness of Breath.  Symptoms began yesterday.  Interventions attempted: Prescription medications: inhaler.  Symptoms are: unchanged.  Triage Disposition: See PCP When Office is Open (Within 3 Days)  Patient/caregiver understands and will follow disposition?: Yes Copied from CRM #[id number]. Topic: Clinical - Red Word Triage >> Aug 19, 2023  9:26 AM [name] wrote: Red Word that prompted transfer to Nurse Triage:Having trouble with breathing Reason for Disposition  [1] MODERATE longstanding difficulty breathing (e.g., speaks in phrases, SOB even at rest, pulse 100-120) AND [2] SAME as normal  Answer Assessment - Initial Assessment Questions 1. RESPIRATORY STATUS: Describe your breathing? (e.g., wheezing, shortness of breath, unable to speak, severe coughing)      SOB w/exertion, wheezing 2. ONSET: When did this breathing problem begin?      yesterday 3. PATTERN Does the difficult breathing come and go, or has it been constant since it started?      Comes and goes 4. SEVERITY: How bad is your breathing? (e.g., mild, moderate, severe)      mild 5. RECURRENT SYMPTOM: Have you had difficulty breathing before? If Yes, ask: When was the last time? and What happened that time?      na 6. CARDIAC HISTORY: Do you have any history of heart disease? (e.g., heart attack, angina, bypass surgery, angioplasty)      na 7. LUNG HISTORY: Do you have any history of lung disease?  (e.g., pulmonary embolus, asthma, emphysema)     Asthma  8. CAUSE: What do you think is causing the breathing problem?      unknown 9. OTHER SYMPTOMS: Do you have any other symptoms? (e.g., chest pain, cough, dizziness, fever, runny nose)     no 10. O2 SATURATION MONITOR:  Do you use an oxygen saturation monitor (pulse oximeter) at home?  If Yes, ask: What is your reading (oxygen level) today? What is your usual oxygen saturation reading? (e.g., 95%)       na 11. PREGNANCY: Is there any chance you are pregnant? When was your last menstrual period?       na 12. TRAVEL: Have you traveled out of the country in the last month? (e.g., travel history, exposures)       na  Protocols used: Breathing Difficulty-A-AH
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
## 2023-08-27 ENCOUNTER — Other Ambulatory Visit: Payer: Self-pay | Admitting: Family Medicine

## 2023-10-24 ENCOUNTER — Other Ambulatory Visit: Payer: Self-pay | Admitting: Family Medicine

## 2023-10-30 ENCOUNTER — Other Ambulatory Visit: Payer: Self-pay | Admitting: Family Medicine

## 2023-11-13 ENCOUNTER — Ambulatory Visit (INDEPENDENT_AMBULATORY_CARE_PROVIDER_SITE_OTHER): Admitting: Family Medicine

## 2023-11-13 ENCOUNTER — Encounter: Payer: Self-pay | Admitting: Family Medicine

## 2023-11-13 ENCOUNTER — Ambulatory Visit: Payer: Self-pay | Admitting: Family Medicine

## 2023-11-13 VITALS — BP 136/90 | HR 85 | Temp 97.7°F | Ht 76.38 in | Wt 269.0 lb

## 2023-11-13 DIAGNOSIS — R5383 Other fatigue: Secondary | ICD-10-CM

## 2023-11-13 DIAGNOSIS — Z0001 Encounter for general adult medical examination with abnormal findings: Secondary | ICD-10-CM

## 2023-11-13 DIAGNOSIS — Z23 Encounter for immunization: Secondary | ICD-10-CM | POA: Diagnosis not present

## 2023-11-13 DIAGNOSIS — Z13220 Encounter for screening for lipoid disorders: Secondary | ICD-10-CM

## 2023-11-13 DIAGNOSIS — Z125 Encounter for screening for malignant neoplasm of prostate: Secondary | ICD-10-CM

## 2023-11-13 DIAGNOSIS — R7303 Prediabetes: Secondary | ICD-10-CM | POA: Diagnosis not present

## 2023-11-13 DIAGNOSIS — I10 Essential (primary) hypertension: Secondary | ICD-10-CM | POA: Diagnosis not present

## 2023-11-13 DIAGNOSIS — Z0000 Encounter for general adult medical examination without abnormal findings: Secondary | ICD-10-CM

## 2023-11-13 LAB — BASIC METABOLIC PANEL WITH GFR
BUN: 14 mg/dL (ref 6–23)
CO2: 27 meq/L (ref 19–32)
Calcium: 9.1 mg/dL (ref 8.4–10.5)
Chloride: 104 meq/L (ref 96–112)
Creatinine, Ser: 1.16 mg/dL (ref 0.40–1.50)
GFR: 73.61 mL/min (ref >60.00)
Glucose, Bld: 103 mg/dL — ABNORMAL HIGH (ref 70–99)
Potassium: 4.3 meq/L (ref 3.5–5.1)
Sodium: 138 meq/L (ref 135–145)

## 2023-11-13 LAB — LIPID PANEL
Cholesterol: 188 mg/dL (ref 0–200)
HDL: 48 mg/dL (ref >39.00)
LDL Cholesterol: 115 mg/dL — ABNORMAL HIGH (ref 0–99)
NonHDL: 139.91
Total CHOL/HDL Ratio: 4
Triglycerides: 127 mg/dL (ref 0.0–149.0)
VLDL: 25.4 mg/dL (ref 0.0–40.0)

## 2023-11-13 LAB — CBC WITH DIFFERENTIAL/PLATELET
Basophils Absolute: 0.1 K/uL (ref 0.0–0.1)
Basophils Relative: 1.3 % (ref 0.0–3.0)
Eosinophils Absolute: 0.4 K/uL (ref 0.0–0.7)
Eosinophils Relative: 7.6 % — ABNORMAL HIGH (ref 0.0–5.0)
HCT: 42.1 % (ref 39.0–52.0)
Hemoglobin: 14.1 g/dL (ref 13.0–17.0)
Lymphocytes Relative: 22 % (ref 12.0–46.0)
Lymphs Abs: 1.2 K/uL (ref 0.7–4.0)
MCHC: 33.4 g/dL (ref 30.0–36.0)
MCV: 92.1 fl (ref 78.0–100.0)
Monocytes Absolute: 0.4 K/uL (ref 0.1–1.0)
Monocytes Relative: 7.9 % (ref 3.0–12.0)
Neutro Abs: 3.4 K/uL (ref 1.4–7.7)
Neutrophils Relative %: 61.2 % (ref 43.0–77.0)
Platelets: 331 K/uL (ref 150.0–400.0)
RBC: 4.57 Mil/uL (ref 4.22–5.81)
RDW: 13.4 % (ref 11.5–15.5)
WBC: 5.5 K/uL (ref 4.0–10.5)

## 2023-11-13 LAB — HEPATIC FUNCTION PANEL
ALT: 23 U/L (ref 0–53)
AST: 17 U/L (ref 0–37)
Albumin: 4.6 g/dL (ref 3.5–5.2)
Alkaline Phosphatase: 61 U/L (ref 39–117)
Bilirubin, Direct: 0.1 mg/dL (ref 0.0–0.3)
Total Bilirubin: 0.5 mg/dL (ref 0.2–1.2)
Total Protein: 6.8 g/dL (ref 6.0–8.3)

## 2023-11-13 LAB — TSH: TSH: 0.5 u[IU]/mL (ref 0.35–5.50)

## 2023-11-13 LAB — HEMOGLOBIN A1C: Hgb A1c MFr Bld: 6 % (ref 4.6–6.5)

## 2023-11-13 LAB — PSA: PSA: 0.6 ng/mL (ref 0.10–4.00)

## 2023-11-13 LAB — TESTOSTERONE: Testosterone: 355.99 ng/dL (ref 300.00–890.00)

## 2023-11-13 NOTE — Addendum Note (Signed)
 Addended by: METTA KRISTEN CROME on: 11/13/2023 08:43 AM   Modules accepted: Orders
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
## 2023-11-25 ENCOUNTER — Encounter: Payer: Self-pay | Admitting: Family Medicine

## 2023-11-25 ENCOUNTER — Ambulatory Visit: Admitting: Family Medicine

## 2023-11-25 VITALS — BP 136/80 | HR 69 | Temp 97.8°F | Wt 272.9 lb

## 2023-11-25 DIAGNOSIS — M5412 Radiculopathy, cervical region: Secondary | ICD-10-CM | POA: Diagnosis not present

## 2023-11-25 MED ORDER — PREDNISONE 20 MG PO TABS: ORAL_TABLET | ORAL | 0 refills | Status: AC

## 2023-11-25 MED ORDER — CYCLOBENZAPRINE HCL 10 MG PO TABS: 10.0000 mg | ORAL_TABLET | Freq: Three times a day (TID) | ORAL | 0 refills | Status: AC | PRN
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
## 2023-12-13 ENCOUNTER — Ambulatory Visit: Admitting: Family Medicine

## 2023-12-27 ENCOUNTER — Other Ambulatory Visit: Payer: Self-pay | Admitting: Family Medicine
